# Patient Record
Sex: Female | Born: 1970
Health system: Southern US, Community
[De-identification: ages and names within clinical notes are randomized; demographics above are authoritative.]

## PROBLEM LIST (undated history)

## (undated) DIAGNOSIS — K859 Acute pancreatitis without necrosis or infection, unspecified: Secondary | ICD-10-CM

## (undated) DIAGNOSIS — E282 Polycystic ovarian syndrome: Secondary | ICD-10-CM

## (undated) DIAGNOSIS — N83209 Unspecified ovarian cyst, unspecified side: Secondary | ICD-10-CM

## (undated) DIAGNOSIS — K831 Obstruction of bile duct: Secondary | ICD-10-CM

## (undated) HISTORY — PX: CHOLECYSTECTOMY: SHX55

## (undated) HISTORY — PX: WISDOM TOOTH EXTRACTION: SHX21

---

## 1999-06-29 ENCOUNTER — Inpatient Hospital Stay (HOSPITAL_COMMUNITY): Admission: AD | Admit: 1999-06-29 | Discharge: 1999-06-29 | Payer: Self-pay | Admitting: Obstetrics and Gynecology

## 2001-09-25 ENCOUNTER — Ambulatory Visit (HOSPITAL_COMMUNITY): Admission: RE | Admit: 2001-09-25 | Discharge: 2001-09-25 | Payer: Self-pay | Admitting: Gynecology

## 2001-09-25 ENCOUNTER — Encounter: Payer: Self-pay | Admitting: Gynecology

## 2001-09-30 ENCOUNTER — Encounter: Payer: Self-pay | Admitting: Obstetrics and Gynecology

## 2001-09-30 ENCOUNTER — Inpatient Hospital Stay (HOSPITAL_COMMUNITY): Admission: AD | Admit: 2001-09-30 | Discharge: 2001-09-30 | Payer: Self-pay | Admitting: *Deleted

## 2003-03-22 ENCOUNTER — Other Ambulatory Visit: Admission: RE | Admit: 2003-03-22 | Discharge: 2003-03-22 | Payer: Self-pay | Admitting: Gynecology

## 2007-07-23 ENCOUNTER — Encounter: Admission: RE | Admit: 2007-07-23 | Discharge: 2007-07-23 | Payer: Self-pay | Admitting: Family Medicine

## 2007-08-09 ENCOUNTER — Encounter: Admission: RE | Admit: 2007-08-09 | Discharge: 2007-08-09 | Payer: Self-pay | Admitting: Family Medicine

## 2008-03-11 ENCOUNTER — Encounter: Admission: RE | Admit: 2008-03-11 | Discharge: 2008-03-11 | Payer: Self-pay | Admitting: Family Medicine

## 2009-06-03 ENCOUNTER — Emergency Department (HOSPITAL_BASED_OUTPATIENT_CLINIC_OR_DEPARTMENT_OTHER): Admission: EM | Admit: 2009-06-03 | Discharge: 2009-06-03 | Payer: Self-pay | Admitting: Emergency Medicine

## 2011-02-10 ENCOUNTER — Emergency Department (HOSPITAL_BASED_OUTPATIENT_CLINIC_OR_DEPARTMENT_OTHER)
Admission: EM | Admit: 2011-02-10 | Discharge: 2011-02-10 | Disposition: A | Discharge status: Home / Self Care | Payer: BC Managed Care – PPO | Attending: Emergency Medicine | Admitting: Emergency Medicine

## 2011-02-10 DIAGNOSIS — K2970 Gastritis, unspecified, without bleeding: Secondary | ICD-10-CM | POA: Insufficient documentation

## 2011-02-10 DIAGNOSIS — K2990 Gastroduodenitis, unspecified, without bleeding: Secondary | ICD-10-CM | POA: Insufficient documentation

## 2011-02-10 DIAGNOSIS — R1013 Epigastric pain: Secondary | ICD-10-CM | POA: Insufficient documentation

## 2011-02-10 LAB — CBC
Hemoglobin: 14.2 g/dL (ref 12.0–15.0)
MCHC: 35.9 g/dL (ref 30.0–36.0)
Platelets: 272 10*3/uL (ref 150–400)

## 2011-02-10 LAB — URINALYSIS, ROUTINE W REFLEX MICROSCOPIC
Bilirubin Urine: NEGATIVE
Hgb urine dipstick: NEGATIVE
Specific Gravity, Urine: 1.012 (ref 1.005–1.030)
pH: 6 (ref 5.0–8.0)

## 2011-02-10 LAB — COMPREHENSIVE METABOLIC PANEL
ALT: 9 U/L (ref 0–35)
AST: 16 U/L (ref 0–37)
Albumin: 4.5 g/dL (ref 3.5–5.2)
Alkaline Phosphatase: 46 U/L (ref 39–117)
GFR calc Af Amer: >60 mL/min (ref >60)
Glucose, Bld: 111 mg/dL — ABNORMAL HIGH (ref 70–99)
Potassium: 3.9 mEq/L (ref 3.5–5.1)
Sodium: 138 mEq/L (ref 135–145)
Total Protein: 7.5 g/dL (ref 6.0–8.3)

## 2011-02-10 LAB — DIFFERENTIAL
Basophils Absolute: 0 10*3/uL (ref 0.0–0.1)
Basophils Relative: 0 % (ref 0–1)
Monocytes Absolute: 0.9 10*3/uL (ref 0.1–1.0)
Neutro Abs: 8 10*3/uL — ABNORMAL HIGH (ref 1.7–7.7)
Neutrophils Relative %: 72 % (ref 43–77)

## 2011-02-10 LAB — PREGNANCY, URINE: Preg Test, Ur: NEGATIVE

## 2011-02-10 LAB — URINE MICROSCOPIC-ADD ON

## 2011-02-13 ENCOUNTER — Emergency Department (HOSPITAL_BASED_OUTPATIENT_CLINIC_OR_DEPARTMENT_OTHER)
Admission: EM | Admit: 2011-02-13 | Discharge: 2011-02-13 | Disposition: A | Discharge status: Home / Self Care | Payer: BC Managed Care – PPO | Attending: Emergency Medicine | Admitting: Emergency Medicine

## 2011-02-13 DIAGNOSIS — K2970 Gastritis, unspecified, without bleeding: Secondary | ICD-10-CM | POA: Insufficient documentation

## 2011-02-13 DIAGNOSIS — R1013 Epigastric pain: Secondary | ICD-10-CM | POA: Insufficient documentation

## 2011-02-13 DIAGNOSIS — K2990 Gastroduodenitis, unspecified, without bleeding: Secondary | ICD-10-CM | POA: Insufficient documentation

## 2011-02-13 LAB — LIPASE, BLOOD: Lipase: 45 U/L (ref 11–59)

## 2011-02-13 LAB — COMPREHENSIVE METABOLIC PANEL
Alkaline Phosphatase: 48 U/L (ref 39–117)
BUN: 9 mg/dL (ref 6–23)
CO2: 25 mEq/L (ref 19–32)
Chloride: 100 mEq/L (ref 96–112)
Creatinine, Ser: 0.6 mg/dL (ref 0.4–1.2)
GFR calc Af Amer: >60 mL/min (ref >60)
GFR calc non Af Amer: >60 mL/min (ref >60)
Glucose, Bld: 93 mg/dL (ref 70–99)
Total Bilirubin: 0.8 mg/dL (ref 0.3–1.2)

## 2016-03-11 ENCOUNTER — Emergency Department (HOSPITAL_BASED_OUTPATIENT_CLINIC_OR_DEPARTMENT_OTHER)
Admission: EM | Admit: 2016-03-11 | Discharge: 2016-03-11 | Disposition: A | Discharge status: Home / Self Care | Payer: BLUE CROSS/BLUE SHIELD | Attending: Emergency Medicine | Admitting: Emergency Medicine

## 2016-03-11 ENCOUNTER — Emergency Department (HOSPITAL_BASED_OUTPATIENT_CLINIC_OR_DEPARTMENT_OTHER): Payer: BLUE CROSS/BLUE SHIELD

## 2016-03-11 ENCOUNTER — Encounter (HOSPITAL_BASED_OUTPATIENT_CLINIC_OR_DEPARTMENT_OTHER): Payer: Self-pay | Admitting: Emergency Medicine

## 2016-03-11 DIAGNOSIS — N7011 Chronic salpingitis: Secondary | ICD-10-CM | POA: Insufficient documentation

## 2016-03-11 DIAGNOSIS — R109 Unspecified abdominal pain: Secondary | ICD-10-CM

## 2016-03-11 DIAGNOSIS — R1032 Left lower quadrant pain: Secondary | ICD-10-CM | POA: Diagnosis present

## 2016-03-11 HISTORY — DX: Unspecified ovarian cyst, unspecified side: N83.209

## 2016-03-11 HISTORY — DX: Acute pancreatitis without necrosis or infection, unspecified: K83.1

## 2016-03-11 HISTORY — DX: Acute pancreatitis without necrosis or infection, unspecified: K85.90

## 2016-03-11 HISTORY — DX: Polycystic ovarian syndrome: E28.2

## 2016-03-11 LAB — URINALYSIS, ROUTINE W REFLEX MICROSCOPIC
Bilirubin Urine: NEGATIVE
Glucose, UA: NEGATIVE mg/dL
Hgb urine dipstick: NEGATIVE
KETONES UR: NEGATIVE mg/dL
LEUKOCYTES UA: NEGATIVE
Nitrite: NEGATIVE
PROTEIN: NEGATIVE mg/dL
Specific Gravity, Urine: 1.006 (ref 1.005–1.030)
pH: 7.5 (ref 5.0–8.0)

## 2016-03-11 LAB — COMPREHENSIVE METABOLIC PANEL
ALBUMIN: 4.2 g/dL (ref 3.5–5.0)
ALT: 14 U/L (ref 14–54)
ANION GAP: 9 (ref 5–15)
AST: 19 U/L (ref 15–41)
Alkaline Phosphatase: 42 U/L (ref 38–126)
BUN: 10 mg/dL (ref 6–20)
CHLORIDE: 105 mmol/L (ref 101–111)
CO2: 23 mmol/L (ref 22–32)
Calcium: 9.2 mg/dL (ref 8.9–10.3)
Creatinine, Ser: 0.68 mg/dL (ref 0.44–1.00)
GFR calc Af Amer: >60 mL/min (ref >60)
GFR calc non Af Amer: >60 mL/min (ref >60)
GLUCOSE: 103 mg/dL — AB (ref 65–99)
POTASSIUM: 3.1 mmol/L — AB (ref 3.5–5.1)
SODIUM: 137 mmol/L (ref 135–145)
TOTAL PROTEIN: 7.1 g/dL (ref 6.5–8.1)
Total Bilirubin: 1.2 mg/dL (ref 0.3–1.2)

## 2016-03-11 LAB — CBC WITH DIFFERENTIAL/PLATELET
Basophils Absolute: 0 10*3/uL (ref 0.0–0.1)
Basophils Relative: 0 %
EOS PCT: 3 %
Eosinophils Absolute: 0.2 10*3/uL (ref 0.0–0.7)
HCT: 39.3 % (ref 36.0–46.0)
Hemoglobin: 14 g/dL (ref 12.0–15.0)
LYMPHS ABS: 2.2 10*3/uL (ref 0.7–4.0)
LYMPHS PCT: 31 %
MCH: 33.3 pg (ref 26.0–34.0)
MCHC: 35.6 g/dL (ref 30.0–36.0)
MCV: 93.6 fL (ref 78.0–100.0)
MONO ABS: 0.6 10*3/uL (ref 0.1–1.0)
MONOS PCT: 8 %
Neutro Abs: 4.1 10*3/uL (ref 1.7–7.7)
Neutrophils Relative %: 58 %
PLATELETS: 291 10*3/uL (ref 150–400)
RBC: 4.2 MIL/uL (ref 3.87–5.11)
RDW: 12.2 % (ref 11.5–15.5)
WBC: 7.1 10*3/uL (ref 4.0–10.5)

## 2016-03-11 LAB — WET PREP, GENITAL
CLUE CELLS WET PREP: NONE SEEN
Sperm: NONE SEEN
TRICH WET PREP: NONE SEEN
YEAST WET PREP: NONE SEEN

## 2016-03-11 LAB — LIPASE, BLOOD: LIPASE: 32 U/L (ref 11–51)

## 2016-03-11 LAB — PREGNANCY, URINE: PREG TEST UR: NEGATIVE

## 2016-03-11 MED ORDER — AZITHROMYCIN 250 MG PO TABS: 1000.0000 mg | ORAL_TABLET | ORAL | Status: DC

## 2016-03-11 MED ORDER — ONDANSETRON HCL 4 MG/2ML IJ SOLN: 4.0000 mg | Freq: Once | INTRAMUSCULAR | Status: DC

## 2016-03-11 MED ORDER — ONDANSETRON HCL 4 MG/2ML IJ SOLN
4.0000 mg | Freq: Once | INTRAMUSCULAR | Status: AC
  Administered 2016-03-11: 4 mg via INTRAVENOUS

## 2016-03-11 MED ORDER — CEFTRIAXONE SODIUM 250 MG IJ SOLR
250.0000 mg | Freq: Once | INTRAMUSCULAR | Status: DC
  Filled 2016-03-11: qty 250

## 2016-03-11 MED ORDER — SODIUM CHLORIDE 0.9 % IV BOLUS (SEPSIS)
1000.0000 mL | Freq: Once | INTRAVENOUS | Status: AC
  Administered 2016-03-11: 1000 mL via INTRAVENOUS

## 2016-03-11 MED ORDER — ONDANSETRON HCL 4 MG/2ML IJ SOLN
INTRAMUSCULAR | Status: AC
  Administered 2016-03-11: 4 mg via INTRAVENOUS
  Filled 2016-03-11: qty 2

## 2016-03-11 MED ORDER — ONDANSETRON 4 MG PO TBDP: ORAL_TABLET | ORAL | Status: DC

## 2016-03-11 MED ORDER — MORPHINE SULFATE (PF) 4 MG/ML IV SOLN
6.0000 mg | Freq: Once | INTRAVENOUS | Status: AC
  Administered 2016-03-11: 6 mg via INTRAVENOUS

## 2016-03-11 MED ORDER — DEXTROSE 5 % IV SOLN
250.0000 mg | Freq: Once | INTRAVENOUS | Status: AC
  Administered 2016-03-11: 250 mg via INTRAVENOUS
  Filled 2016-03-11: qty 250

## 2016-03-11 MED ORDER — ONDANSETRON HCL 4 MG/2ML IJ SOLN: 4.0000 mg | Freq: Once | INTRAMUSCULAR | Status: DC | PRN

## 2016-03-11 MED ORDER — MORPHINE SULFATE 15 MG PO TABS: 15.0000 mg | ORAL_TABLET | ORAL | Status: DC | PRN

## 2016-03-11 MED ORDER — AZITHROMYCIN 250 MG PO TABS
1000.0000 mg | ORAL_TABLET | Freq: Once | ORAL | Status: AC
  Administered 2016-03-11: 1000 mg via ORAL
  Filled 2016-03-11: qty 4

## 2016-03-11 MED FILL — ONDANSETRON ODT 4 MG TABLET: 4 | 3 days supply | Qty: 20 | Fill #0

## 2016-03-11 MED FILL — AZITHROMYCIN 250 MG TABLET: 250 | 1 days supply | Qty: 4 | Fill #0

## 2016-03-11 MED FILL — MORPHINE SULFATE IR 15 MG T: 15 | 1 days supply | Qty: 7 | Fill #0

## 2016-03-11 NOTE — ED Notes (Signed)
Pt having LLQ abdominal pain since 6 am.  No N/V.  No fever.  No back pain.  No history of kidney stones.  Pt states she has had ruptured ovarian cyst.

## 2016-03-11 NOTE — ED Provider Notes (Signed)
CSN: [id number]     Arrival date & time 03/11/16  0732 History   First MD Initiated Contact with Patient 03/11/16 [phone]     Chief Complaint  Patient presents with  . Abdominal Pain     (Consider location/radiation/quality/duration/timing/severity/associated sxs/prior Treatment) Patient is a 45 y.o. female presenting with abdominal pain. The history is provided by the patient.  Abdominal Pain Pain location:  LLQ Pain quality: sharp and shooting   Pain radiates to:  Does not radiate Pain severity:  Severe Onset quality:  Sudden Duration:  1 hour Timing:  Constant Progression:  Worsening Chronicity:  New Relieved by:  Nothing Worsened by:  Nothing tried Ineffective treatments:  None tried Associated symptoms: no chest pain, no chills, no dysuria, no fever, no nausea, no shortness of breath, no vaginal discharge and no vomiting     45 yo F With a chief complaint of sudden onset left adnexal tenderness. The started about an hour ago. Sharp severe. Pain is unrelenting. Nothing seems to make it better or worse. Denies vaginal bleeding vaginal discharge. Denies likelihood being pregnant. Patient has had a history of multiple ovarian cysts in the past as well as a ruptured ovarian cyst. She is unsure if this feels similar. She denies fevers or chills denies diarrhea.  Past Medical History  Diagnosis Date  . PCOD (polycystic ovarian disease)   . Ruptured ovarian cyst   . Pancreatitis due to biliary obstruction    Past Surgical History  Procedure Laterality Date  . Cholecystectomy     No family history on file. Social History  Substance Use Topics  . Smoking status: Never Smoker   . Smokeless tobacco: None  . Alcohol Use: None   OB History    No data available     Review of Systems  Constitutional: Negative for fever and chills.  HENT: Negative for congestion and rhinorrhea.   Eyes: Negative for redness and visual disturbance.  Respiratory: Negative for shortness of breath  and wheezing.   Cardiovascular: Negative for chest pain and palpitations.  Gastrointestinal: Positive for abdominal pain. Negative for nausea and vomiting.  Genitourinary: Positive for pelvic pain. Negative for dysuria, urgency, flank pain, vaginal discharge, difficulty urinating and dyspareunia.  Musculoskeletal: Negative for myalgias and arthralgias.  Skin: Negative for pallor and wound.  Neurological: Negative for dizziness and headaches.      Allergies  Erythromycin; Latex; Lidocaine; Sulfa antibiotics; and Tetracyclines & related  Home Medications   Prior to Admission medications   Medication Sig Start Date End Date Taking? Authorizing Provider  azithromycin (ZITHROMAX) 250 MG tablet Take 4 tablets (1,000 mg total) by mouth once a week. 4 tabs by mouth x1 on 03/18/16 03/11/16   Melene Plan, DO  morphine (MSIR) 15 MG tablet Take 1 tablet (15 mg total) by mouth every 4 (four) hours as needed for severe pain. 03/11/16   Melene Plan, DO  ondansetron (ZOFRAN ODT) 4 MG disintegrating tablet 4mg  ODT q4 hours prn nausea/vomit 03/11/16   Melene Plan, DO   BP 107/73 mmHg  Pulse 56  Temp(Src) 98.2 F (36.8 C) (Oral)  Resp 16  SpO2 99%  LMP 02/15/2016 Physical Exam  Constitutional: She is oriented to person, place, and time. She appears well-developed and well-nourished. No distress.  HENT:  Head: Normocephalic and atraumatic.  Eyes: EOM are normal. Pupils are equal, round, and reactive to light.  Neck: Normal range of motion. Neck supple.  Cardiovascular: Normal rate and regular rhythm.  Exam reveals no gallop and  no friction rub.   No murmur heard. Pulmonary/Chest: Effort normal. She has no wheezes. She has no rales.  Abdominal: Soft. She exhibits no distension. There is no tenderness.  Genitourinary: Uterus is tender. Cervix exhibits discharge (brownish) and friability. Cervix exhibits no motion tenderness. Right adnexum displays no mass and no tenderness. Left adnexum displays tenderness.  Left adnexum displays no mass and no fullness.  Musculoskeletal: She exhibits no edema or tenderness.  Neurological: She is alert and oriented to person, place, and time.  Skin: Skin is warm and dry. She is not diaphoretic.  Psychiatric: She has a normal mood and affect. Her behavior is normal.  Nursing note and vitals reviewed.   ED Course  Procedures (including critical care time) Labs Review Labs Reviewed  WET PREP, GENITAL - Abnormal; Notable for the following:    WBC, Wet Prep HPF POC MANY (*)    All other components within normal limits  COMPREHENSIVE METABOLIC PANEL - Abnormal; Notable for the following:    Potassium 3.1 (*)    Glucose, Bld 103 (*)    All other components within normal limits  LIPASE, BLOOD  URINALYSIS, ROUTINE W REFLEX MICROSCOPIC (NOT AT [facility])  PREGNANCY, URINE  CBC WITH DIFFERENTIAL/PLATELET  GC/CHLAMYDIA PROBE AMP ([place]) NOT AT [facility]    Imaging Review [place] Transvaginal Non-ob  03/11/2016  CLINICAL DATA:  Left lower quadrant pain since 2 a.m. this morning. EXAM: TRANSABDOMINAL AND TRANSVAGINAL ULTRASOUND OF PELVIS TECHNIQUE: Both transabdominal and transvaginal ultrasound examinations of the pelvis were performed. Transabdominal technique was performed for global imaging of the pelvis including uterus, ovaries, adnexal regions, and pelvic cul-de-sac. It was necessary to proceed with endovaginal exam following the transabdominal exam to visualize the uterus, endometrium, ovaries and adnexa . COMPARISON:  None FINDINGS: Uterus Measurements: 6.8 x 5.6 x 6.2 cm. No fibroids or other mass visualized. Endometrium Thickness: 10 mm in thickness.  No focal abnormality visualized. Right ovary Measurements: 2.8 x 1.8 x 2.1 cm. Normal appearance/no adnexal mass. Left ovary Measurements: 2.2 x 1.8 x 1.8 cm. Hypoechoic fluid-filled tube noted adjacent to the left ovary compatible with hydrosalpinx. Other findings No abnormal free fluid. IMPRESSION: Left hydrosalpinx. No  acute findings. Electronically Signed   By: Charlett NoseKevin  Dover M.D.   On: 03/11/2016 09:45   [place] Pelvis Complete  03/11/2016  CLINICAL DATA:  Left lower quadrant pain since 2 a.m. this morning. EXAM: TRANSABDOMINAL AND TRANSVAGINAL ULTRASOUND OF PELVIS TECHNIQUE: Both transabdominal and transvaginal ultrasound examinations of the pelvis were performed. Transabdominal technique was performed for global imaging of the pelvis including uterus, ovaries, adnexal regions, and pelvic cul-de-sac. It was necessary to proceed with endovaginal exam following the transabdominal exam to visualize the uterus, endometrium, ovaries and adnexa . COMPARISON:  None FINDINGS: Uterus Measurements: 6.8 x 5.6 x 6.2 cm. No fibroids or other mass visualized. Endometrium Thickness: 10 mm in thickness.  No focal abnormality visualized. Right ovary Measurements: 2.8 x 1.8 x 2.1 cm. Normal appearance/no adnexal mass. Left ovary Measurements: 2.2 x 1.8 x 1.8 cm. Hypoechoic fluid-filled tube noted adjacent to the left ovary compatible with hydrosalpinx. Other findings No abnormal free fluid. IMPRESSION: Left hydrosalpinx. No acute findings. Electronically Signed   By: Charlett NoseKevin  Dover M.D.   On: 03/11/2016 09:45   I have personally reviewed and evaluated these images and lab results as part of my medical decision-making.   EKG Interpretation None      MDM   Final diagnoses:  Abdominal pain  Hydrosalpinx  45 yo F with a chief complaint of left adnexal pain. This was sudden in onset I'm concerned for ovarian pathlology. Ultrasound ordered.  L hydrosalpinx on [place].   Discussed with H-Smith, GYN, will treat for possible STD.  Follow up in clinic.   12:06 PM:  I have discussed the diagnosis/risks/treatment options with the patient and family and believe the pt to be eligible for discharge home to follow-up with Gyn. We also discussed returning to the ED immediately if new or worsening sx occur. We discussed the sx which are most  concerning (e.g., sudden worsening pain, fever, inability to tolerate by mouth) that necessitate immediate return. Medications administered to the patient during their visit and any new prescriptions provided to the patient are listed below.  Medications given during this visit Medications  morphine 4 MG/ML injection 6 mg (6 mg Intravenous Given 03/11/16 0810)  sodium chloride 0.9 % bolus 1,000 mL (0 mLs Intravenous Stopped 03/11/16 1051)  ondansetron (ZOFRAN) injection 4 mg (4 mg Intravenous Given 03/11/16 0810)  azithromycin (ZITHROMAX) tablet 1,000 mg (1,000 mg Oral Given 03/11/16 1040)  cefTRIAXone (ROCEPHIN) 250 mg in dextrose 5 % 50 mL IVPB (0 mg Intravenous Stopped 03/11/16 1137)  ondansetron (ZOFRAN) injection 4 mg (4 mg Intravenous Given 03/11/16 1136)    Discharge Medication List as of 03/11/2016 10:31 AM    START taking these medications   Details  azithromycin (ZITHROMAX) 250 MG tablet Take 4 tablets (1,000 mg total) by mouth once a week. 4 tabs by mouth x1 on 03/18/16, Starting 03/11/2016, Until Discontinued, Print    morphine (MSIR) 15 MG tablet Take 1 tablet (15 mg total) by mouth every 4 (four) hours as needed for severe pain., Starting 03/11/2016, Until Discontinued, Print    ondansetron (ZOFRAN ODT) 4 MG disintegrating tablet  ODT q4 hours prn nausea/vomit, Print        The patient appears reasonably screen and/or stabilized for discharge and I doubt any other medical condition or other [facility] requiring further screening, evaluation, or treatment in the ED at this time prior to discha27rge.      Melene Plan, DO 03/11/16 1206

## 2016-03-11 NOTE — Discharge Instructions (Signed)
GYN should give you a call in the next few days for an appointment in a week or two.  Pelvic Inflammatory Disease Pelvic inflammatory disease (PID) refers to an infection in some or all of the female organs. The infection can be in the uterus, ovaries, fallopian tubes, or the surrounding tissues in the pelvis. PID can cause abdominal or pelvic pain that comes on suddenly (acute pelvic pain). PID is a serious infection because it can lead to lasting (chronic) pelvic pain or the inability to have children (infertility). CAUSES This condition is most often caused by an infection that is spread during sexual contact. However, the infection can also be caused by the normal bacteria that are found in the vaginal tissues if these bacteria travel upward into the reproductive organs. PID can also occur following:  The birth of a baby.  A miscarriage.  An abortion.  Major pelvic surgery.  The use of an intrauterine device (IUD).  A sexual assault. RISK FACTORS This condition is more likely to develop in women who:  Are younger than 43 years of age.  Are sexually active at [facility] age.  Use nonbarrier contraception.  Have multiple sexual partners.  Have sex with someone who has symptoms of an STD (sexually transmitted disease).  Use oral contraception. At times, certain behaviors can also increase the possibility of getting PID, such as:  Using a vaginal douche.  Having an IUD in place. SYMPTOMS Symptoms of this condition include:  Abdominal or pelvic pain.  Fever.  Chills.  Abnormal vaginal discharge.  Abnormal uterine bleeding.  Unusual pain shortly after the end of a menstrual period.  Painful urination.  Pain with sexual intercourse.  Nausea and vomiting. DIAGNOSIS To diagnose this condition, your health care provider will do a physical exam and take your medical history. A pelvic exam typically reveals great tenderness in the uterus and the surrounding pelvic  tissues. You may also have tests, such as:  Lab tests, including a pregnancy test, blood tests, and urine test.  Culture tests of the vagina and cervix to check for an STD.  Ultrasound.  A laparoscopic procedure to look inside the pelvis.  Examining vaginal secretions under a microscope. TREATMENT Treatment for this condition may involve one or more approaches.  Antibiotic medicines may be prescribed to be taken by mouth.  Sexual partners may need to be treated if the infection is caused by an STD.  For more severe cases, hospitalization may be needed to give antibiotics directly into a vein through an IV tube.  Surgery may be needed if other treatments do not help, but this is rare. It may take weeks until you are completely well. If you are diagnosed with PID, you should also be checked for human immunodeficiency virus (HIV). Your health care provider may test you for infection again 3 months after treatment. You should not have unprotected sex. HOME CARE INSTRUCTIONS  Take over-the-counter and prescription medicines only as told by your health care provider.  If you were prescribed an antibiotic medicine, take it as told by your health care provider. Do not stop taking the antibiotic even if you start to feel better.  Do not have sexual intercourse until treatment is completed or as told by your health care provider. If PID is confirmed, your recent sexual partners will need treatment, especially if you had unprotected sex.  Keep all follow-up visits as told by your health care provider. This is important. SEEK MEDICAL CARE IF:  You have increased  or abnormal vaginal discharge.  Your pain does not improve.  You vomit.  You have a fever.  You cannot tolerate your medicines.  Your partner has an STD.  You have pain when you urinate. SEEK IMMEDIATE MEDICAL CARE IF:  You have increased abdominal or pelvic pain.  You have chills.  Your symptoms are not better in 72  hours even with treatment.   This information is not intended to replace advice given to you by your health care provider. Make sure you discuss any questions you have with your health care provider.   Document Released: 08/19/2005 Document Revised: 05/10/2015 Document Reviewed: 09/26/2014 Elsevier Interactive Patient Education [employer]2016 Elsevier Inc.

## 2016-03-12 LAB — GC/CHLAMYDIA PROBE AMP (~~LOC~~) NOT AT ARMC
Chlamydia: NEGATIVE
NEISSERIA GONORRHEA: NEGATIVE

## 2016-03-21 ENCOUNTER — Ambulatory Visit (INDEPENDENT_AMBULATORY_CARE_PROVIDER_SITE_OTHER): Payer: BLUE CROSS/BLUE SHIELD | Admitting: Obstetrics & Gynecology

## 2016-03-21 ENCOUNTER — Encounter: Payer: Self-pay | Admitting: Obstetrics & Gynecology

## 2016-03-21 ENCOUNTER — Encounter: Payer: Managed Care, Other (non HMO) | Admitting: Obstetrics & Gynecology

## 2016-03-21 VITALS — BP 127/79 | HR 79 | Ht 65.5 in | Wt 139.0 lb

## 2016-03-21 DIAGNOSIS — N7011 Chronic salpingitis: Secondary | ICD-10-CM | POA: Diagnosis not present

## 2016-03-21 NOTE — Patient Instructions (Signed)
You need a pap smear soon, please call to set up appointment

## 2016-03-21 NOTE — Progress Notes (Signed)
CLINIC ENCOUNTER NOTE  History:  45 y.o. P1 here today for follow up after being evaluated for LLQ pain in [facility] and being diagnosed with hydrosalpinx on 03/11/16. She was given analgesia and treated for presumptive PID. Since then, pain has decreased. Denies any fevers or worsening symptoms.  She denies any abnormal vaginal discharge, bleeding, pelvic pain or other concerns.   Past Medical History  Diagnosis Date  . PCOD (polycystic ovarian disease)   . Ruptured ovarian cyst   . Pancreatitis due to biliary obstruction     Past Surgical History  Procedure Laterality Date  . Cholecystectomy      The following portions of the patient's history were reviewed and updated as appropriate: allergies, current medications, past family history, past medical history, past social history, past surgical history and problem list.   Health Maintenance:  Normal pap several years ago.  Review of Systems:  Pertinent items noted in HPI and remainder of comprehensive ROS otherwise negative.  Objective:  Physical Exam BP 127/79 mmHg  Pulse 79  Ht 5' 5.5" (1.664 m)  Wt 139 lb (63.05 kg)  BMI 22.77 kg/m2  LMP 02/15/2016 CONSTITUTIONAL: Well-developed, well-nourished female in no acute distress.  HENT:  Normocephalic, atraumatic. External right and left ear normal. Oropharynx is clear and moist EYES: Conjunctivae and EOM are normal. Pupils are equal, round, and reactive to light. No scleral icterus.  NECK: Normal range of motion, supple, no masses SKIN: Skin is warm and dry. No rash noted. Not diaphoretic. No erythema. No pallor. NEUROLOGIC: Alert and oriented to person, place, and time. Normal reflexes, muscle tone coordination. No cranial nerve deficit noted. PSYCHIATRIC: Normal mood and affect. Normal behavior. Normal judgment and thought content. CARDIOVASCULAR: Normal heart rate noted RESPIRATORY: Effort and breath sounds normal, no problems with respiration noted ABDOMEN: Soft, no  distention noted. No tenderness on palpation in lowe abdomen.   PELVIC: Deferred MUSCULOSKELETAL: Normal range of motion. No edema noted.  Labs and Imaging  Results for orders placed or performed during the hospital encounter of 03/11/16 (from the past 336 hour(s))  GC/Chlamydia probe amp ([place])not at [facility]   Collection Time: 03/11/16 12:00 AM  Result Value Ref Range   Chlamydia Negative    Neisseria gonorrhea Negative   Wet prep, genital   Collection Time: 03/11/16  7:50 AM  Result Value Ref Range   Yeast Wet Prep HPF POC NONE SEEN NONE SEEN   Trich, Wet Prep NONE SEEN NONE SEEN   Clue Cells Wet Prep HPF POC NONE SEEN NONE SEEN   WBC, Wet Prep HPF POC MANY (A) NONE SEEN   Sperm NONE SEEN   Lipase, blood   Collection Time: 03/11/16  8:00 AM  Result Value Ref Range   Lipase 32 11 - 51 U/L  Comprehensive metabolic panel   Collection Time: 03/11/16  8:00 AM  Result Value Ref Range   Sodium 137 135 - 145 mmol/L   Potassium 3.1 (L) 3.5 - 5.1 mmol/L   Chloride 105 101 - 111 mmol/L   CO2 23 22 - 32 mmol/L   Glucose, Bld 103 (H) 65 - 99 mg/dL   BUN 10 6 - 20 mg/dL   Creatinine, Ser 4.54 0.44 - 1.00 mg/dL   Calcium 9.2 8.9 - 09.8 mg/dL   Total Protein 7.1 6.5 - 8.1 g/dL   Albumin 4.2 3.5 - 5.0 g/dL   AST 19 15 - 41 U/L   ALT 14 14 - 54 U/L   Alkaline Phosphatase  42 38 - 126 U/L   Total Bilirubin 1.2 0.3 - 1.2 mg/dL   GFR calc non Af Amer >60 >60 mL/min   GFR calc Af Amer >60 >60 mL/min   Anion gap 9 5 - 15  CBC with Differential   Collection Time: 03/11/16  8:00 AM  Result Value Ref Range   WBC 7.1 4.0 - 10.5 K/uL   RBC 4.20 3.87 - 5.11 MIL/uL   Hemoglobin 14.0 12.0 - 15.0 g/dL   HCT 87.539.3 64.336.0 - 32.946.0 %   MCV 93.6 78.0 - 100.0 fL   MCH 33.3 26.0 - 34.0 pg   MCHC 35.6 30.0 - 36.0 g/dL   RDW 51.812.2 84.111.5 - 66.015.5 %   Platelets 291 150 - 400 K/uL   Neutrophils Relative % 58 %   Neutro Abs 4.1 1.7 - 7.7 K/uL   Lymphocytes Relative 31 %   Lymphs Abs 2.2 0.7 - 4.0 K/uL    Monocytes Relative 8 %   Monocytes Absolute 0.6 0.1 - 1.0 K/uL   Eosinophils Relative 3 %   Eosinophils Absolute 0.2 0.0 - 0.7 K/uL   Basophils Relative 0 %   Basophils Absolute 0.0 0.0 - 0.1 K/uL  Urinalysis, Routine w reflex microscopic   Collection Time: 03/11/16  9:10 AM  Result Value Ref Range   Color, Urine YELLOW YELLOW   APPearance CLEAR CLEAR   Specific Gravity, Urine 1.006 1.005 - 1.030   pH 7.5 5.0 - 8.0   Glucose, UA NEGATIVE NEGATIVE mg/dL   Hgb urine dipstick NEGATIVE NEGATIVE   Bilirubin Urine NEGATIVE NEGATIVE   Ketones, ur NEGATIVE NEGATIVE mg/dL   Protein, ur NEGATIVE NEGATIVE mg/dL   Nitrite NEGATIVE NEGATIVE   Leukocytes, UA NEGATIVE NEGATIVE  Pregnancy, urine   Collection Time: 03/11/16  9:10 AM  Result Value Ref Range   Preg Test, Ur NEGATIVE NEGATIVE    [place] Transvaginal Non-ob  03/11/2016  CLINICAL DATA:  Left lower quadrant pain since 2 a.m. this morning. EXAM: TRANSABDOMINAL AND TRANSVAGINAL ULTRASOUND OF PELVIS TECHNIQUE: Both transabdominal and transvaginal ultrasound examinations of the pelvis were performed. Transabdominal technique was performed for global imaging of the pelvis including uterus, ovaries, adnexal regions, and pelvic cul-de-sac. It was necessary to proceed with endovaginal exam following the transabdominal exam to visualize the uterus, endometrium, ovaries and adnexa . COMPARISON:  None FINDINGS: Uterus Measurements: 6.8 x 5.6 x 6.2 cm. No fibroids or other mass visualized. Endometrium Thickness: 10 mm in thickness.  No focal abnormality visualized. Right ovary Measurements: 2.8 x 1.8 x 2.1 cm. Normal appearance/no adnexal mass. Left ovary Measurements: 2.2 x 1.8 x 1.8 cm. Hypoechoic fluid-filled tube noted adjacent to the left ovary compatible with hydrosalpinx. Other findings No abnormal free fluid. IMPRESSION: Left hydrosalpinx. No acute findings. Electronically Signed   By: Charlett NoseKevin  Dover M.D.   On: 03/11/2016 09:45   [place] Pelvis  Complete  03/11/2016  CLINICAL DATA:  Left lower quadrant pain since 2 a.m. this morning. EXAM: TRANSABDOMINAL AND TRANSVAGINAL ULTRASOUND OF PELVIS TECHNIQUE: Both transabdominal and transvaginal ultrasound examinations of the pelvis were performed. Transabdominal technique was performed for global imaging of the pelvis including uterus, ovaries, adnexal regions, and pelvic cul-de-sac. It was necessary to proceed with endovaginal exam following the transabdominal exam to visualize the uterus, endometrium, ovaries and adnexa . COMPARISON:  None FINDINGS: Uterus Measurements: 6.8 x 5.6 x 6.2 cm. No fibroids or other mass visualized. Endometrium Thickness: 10 mm in thickness.  No focal abnormality visualized. Right ovary Measurements:  2.8 x 1.8 x 2.1 cm. Normal appearance/no adnexal mass. Left ovary Measurements: 2.2 x 1.8 x 1.8 cm. Hypoechoic fluid-filled tube noted adjacent to the left ovary compatible with hydrosalpinx. Other findings No abnormal free fluid. IMPRESSION: Left hydrosalpinx. No acute findings. Electronically Signed   By: Charlett Nose M.D.   On: 03/11/2016 09:45    Assessment & Plan:  Hydrosalpinx Pain is improved, no worsening symptoms. Already being treated for presumptive PID. No further intervention needed at this point.  Labs and imaging studies reviewed with patient. Routine preventative health maintenance measures emphasized, she was told to make appointment for pap smear. Please refer to After Visit Summary for other counseling recommendations.   Return if symptoms worsen or fail to improve.   Total face-to-face time with patient: 20 minutes. Over 50% of encounter was spent on counseling and coordination of care.   Jaynie Collins, MD, FACOG Attending Obstetrician & Gynecologist, [facility] for [employer], [facility]

## 2018-08-03 ENCOUNTER — Encounter: Payer: Self-pay | Admitting: Pediatrics

## 2018-08-03 ENCOUNTER — Ambulatory Visit (INDEPENDENT_AMBULATORY_CARE_PROVIDER_SITE_OTHER): Payer: Managed Care, Other (non HMO) | Admitting: Pediatrics

## 2018-08-03 VITALS — BP 108/66 | HR 73 | Temp 98.3°F | Resp 16 | Ht 65.5 in | Wt 132.0 lb

## 2018-08-03 DIAGNOSIS — L500 Allergic urticaria: Secondary | ICD-10-CM | POA: Diagnosis not present

## 2018-08-03 DIAGNOSIS — K581 Irritable bowel syndrome with constipation: Secondary | ICD-10-CM | POA: Diagnosis not present

## 2018-08-03 DIAGNOSIS — E739 Lactose intolerance, unspecified: Secondary | ICD-10-CM | POA: Insufficient documentation

## 2018-08-03 DIAGNOSIS — J3089 Other allergic rhinitis: Secondary | ICD-10-CM

## 2018-08-03 DIAGNOSIS — J4520 Mild intermittent asthma, uncomplicated: Secondary | ICD-10-CM | POA: Diagnosis not present

## 2018-08-03 MED ORDER — ALBUTEROL SULFATE HFA 108 (90 BASE) MCG/ACT IN AERS: INHALATION_SPRAY | RESPIRATORY_TRACT | 1 refills | Status: AC

## 2018-08-03 NOTE — Progress Notes (Signed)
[address] [place] [address] Dept: [phone]  New Patient Note  Patient ID: Brandi Davidson, female    DOB: 18-Feb-1971  Age: 47 y.o. MRN: [id number] Date of Office Visit: 08/03/2018 Referring provider: No referring provider defined for this encounter.    Chief Complaint: Allergies and Allergic Reaction (posion ivy 3 months ago hives whole body )  HPI Brandi Davidson presents for evaluation of hives since early October following poison ivy.  She has had seasonal allergic rhinitis since age 68.  She used to have asthma.  She has aggravation of her nasal symptoms on exposure to dust, possibly cats, possibly dogs, exposure to fragrances and temperature changes.  She has not had pneumonia.  She does not have gastroesophageal reflux.  She has a history of eczema.  She has IBS with constipation .  She had some food sensitivity testing in early November and has been avoiding dairy , eggs, oats, wheat , peppers, pineapple, yeast and other foods.. She has some lactose intolerance.  At this time she has shortness of breath only when she is exposed to strong colognes or perfumes  Review of Systems  Constitutional: Negative.   HENT:       Allergic rhinitis since age 49  Eyes: Negative.   Respiratory:       History of asthma when she was younger but no longer a problem  Cardiovascular: Negative.   Gastrointestinal:       IBS with chronic constipation.  Cholecystectomy  Genitourinary: Negative.   Musculoskeletal: Negative.   Skin:       Poison ivy dermatitis in October.  History of eczema.  Hives since mid October  Neurological:       History of migraine headaches  Endo/Heme/Allergies:       No diabetes.  Low thyroid function  Psychiatric/Behavioral: Negative.     Outpatient Encounter Medications as of 08/03/2018  Medication Sig  . cetirizine (ZYRTEC) 10 MG tablet Take 10 mg by mouth daily.  . fexofenadine (ALLEGRA) 180 MG tablet Take by mouth.  Marland Kitchen ibuprofen (ADVIL,MOTRIN) 800 MG  tablet Take 800 mg by mouth every 8 (eight) hours as needed.  Marland Kitchen LINZESS 290 MCG CAPS capsule   . triamcinolone (NASACORT) 55 MCG/ACT AERO nasal inhaler Place 2 sprays into the nose daily.  . [DISCONTINUED] triamcinolone (NASACORT) 55 MCG/ACT AERO nasal inhaler Place into the nose.  . albuterol (PROAIR HFA) 108 (90 Base) MCG/ACT inhaler Two puffs every 4 hours if needed for wheezing or coughing spells.  May use 2 puffs 5-15 minutes before exercise.  . [DISCONTINUED] azithromycin (ZITHROMAX) 250 MG tablet Take 4 tablets (1,000 mg total) by mouth once a week. 4 tabs by mouth x1 on 03/18/16 (Patient not taking: Reported on 03/21/2016)  . [DISCONTINUED] clonazePAM (KLONOPIN) 1 MG tablet   . [DISCONTINUED] morphine (MSIR) 15 MG tablet Take 1 tablet (15 mg total) by mouth every 4 (four) hours as needed for severe pain. (Patient not taking: Reported on 03/21/2016)  . [DISCONTINUED] ondansetron (ZOFRAN ODT) 4 MG disintegrating tablet 4mg  ODT q4 hours prn nausea/vomit (Patient not taking: Reported on 03/21/2016)   No facility-administered encounter medications on file as of 08/03/2018.      Drug Allergies:  Allergies  Allergen Reactions  . Adhesive [Tape]   . Erythromycin   . Latex   . Lidocaine   . Sulfa Antibiotics   . Tetracyclines & Related     Family History: Shatia's family history includes Allergic rhinitis in her son; Asthma in her  maternal grandmother and mother; Bronchitis in her maternal grandmother and mother; COPD in her mother; Eczema in her son; Emphysema in her maternal grandmother and mother; Hypertension in her sister..  Family history is negative for angioedema, chronic urticaria, food allergies, lupus, chronic bronchitis or emphysema  Social and environmental.  She has 2 dogs in the home and a turtle.  She is not exposed to cigarette smoking.  She smoked cigarettes for 12 years in the past averaging 1 pack/day.  She is a Environmental managerphotographer.  Physical Exam: BP 108/66   Pulse 73   Temp  98.3 F (36.8 C) (Oral)   Resp 16   Ht 5' 5.5" (1.664 m)   Wt 132 lb (59.9 kg)   SpO2 99%   BMI 21.63 kg/m    Physical Exam  Constitutional: She is oriented to person, place, and time. She appears well-developed and well-nourished.  HENT:  Eyes normal.  Ears normal.  Nose normal.  Pharynx normal.  Neck: Neck supple. No thyromegaly present.  Cardiovascular:  S1-S2 normal no murmurs  Pulmonary/Chest:  Clear to percussion and auscultation  Abdominal: Soft. There is no tenderness.  No hepatosplenomegaly  Lymphadenopathy:    She has no cervical adenopathy.  Neurological: She is alert and oriented to person, place, and time.  Skin:  Clear except for a few scars were she had poison ivy  Psychiatric: She has a normal mood and affect. Her behavior is normal. Judgment and thought content normal.  Vitals reviewed.   Diagnostics: Allergy skin tests were positive to cat, dog.  Mild reactivity to some molds on intradermal testing only.  Skin testing to foods was negative  FVC 3.82 L FEV1 3.48 L.  Predicted FVC 3.89 L predicted FEV1 3.12 L-the spirometry is in the normal range   Assessment  Assessment and Plan: 1. Mild intermittent asthma without complication   2. Other allergic rhinitis   3. Allergic urticaria   4. Irritable bowel syndrome with constipation   5. Lactose intolerance     Meds ordered this encounter  Medications  . albuterol (PROAIR HFA) 108 (90 Base) MCG/ACT inhaler    Sig: Two puffs every 4 hours if needed for wheezing or coughing spells.  May use 2 puffs 5-15 minutes before exercise.    Dispense:  18 g    Refill:  1    Patient Instructions  Environmental control of dust and mold Keep the dog out of your bedroom Allegra 180 mg-take 1 tablet once a day for itching.  You may add Benadryl 25 mg at night Nasacort 2 sprays per nostril at night for stuffy nose OpconA-1 drop 3 times a day if needed for itchy eyes Pro-air 2 puffs every 4 hours if needed for  wheezing or coughing spells.  You may use Pro-air 2 puffs 5 to 15 minutes before exercise Do foods with salicylates make you itch? Call us if you are not doing well on this treatment plan   Return in about 4 weeks (around 08/31/2018).   Thank you for the opportunity to care for this patient.  Please do not hesitate to contact me with questions.  Tonette BihariJ. A. Caitlyn Buchanan, M.D.  [facility] [address] [place], [place] [address] [phone]

## 2018-08-03 NOTE — Patient Instructions (Addendum)
Environmental control of dust and mold Keep the dog out of your bedroom Allegra 180 mg-take 1 tablet once a day for itching.  You may add Benadryl 25 mg at night Nasacort 2 sprays per nostril at night for stuffy nose OpconA-1 drop 3 times a day if needed for itchy eyes Pro-air 2 puffs every 4 hours if needed for wheezing or coughing spells.  You may use Pro-air 2 puffs 5 to 15 minutes before exercise Do foods with salicylates make you itch? Call us if you are not doing well on this treatment plan

## 2018-08-31 ENCOUNTER — Ambulatory Visit: Payer: Self-pay | Admitting: Pediatrics

## 2019-09-02 ENCOUNTER — Emergency Department (HOSPITAL_BASED_OUTPATIENT_CLINIC_OR_DEPARTMENT_OTHER): Payer: Managed Care, Other (non HMO)

## 2019-09-02 ENCOUNTER — Ambulatory Visit: Payer: Self-pay

## 2019-09-02 ENCOUNTER — Encounter (HOSPITAL_BASED_OUTPATIENT_CLINIC_OR_DEPARTMENT_OTHER): Payer: Self-pay

## 2019-09-02 ENCOUNTER — Other Ambulatory Visit: Payer: Self-pay

## 2019-09-02 ENCOUNTER — Emergency Department (HOSPITAL_BASED_OUTPATIENT_CLINIC_OR_DEPARTMENT_OTHER)
Admission: EM | Admit: 2019-09-02 | Discharge: 2019-09-02 | Disposition: A | Discharge status: Home / Self Care | Payer: Managed Care, Other (non HMO) | Attending: Emergency Medicine | Admitting: Emergency Medicine

## 2019-09-02 DIAGNOSIS — Z91048 Other nonmedicinal substance allergy status: Secondary | ICD-10-CM | POA: Insufficient documentation

## 2019-09-02 DIAGNOSIS — N83202 Unspecified ovarian cyst, left side: Secondary | ICD-10-CM | POA: Diagnosis not present

## 2019-09-02 DIAGNOSIS — Z881 Allergy status to other antibiotic agents status: Secondary | ICD-10-CM | POA: Diagnosis not present

## 2019-09-02 DIAGNOSIS — R1032 Left lower quadrant pain: Secondary | ICD-10-CM | POA: Diagnosis present

## 2019-09-02 DIAGNOSIS — Z91040 Latex allergy status: Secondary | ICD-10-CM | POA: Diagnosis not present

## 2019-09-02 DIAGNOSIS — Z882 Allergy status to sulfonamides status: Secondary | ICD-10-CM | POA: Insufficient documentation

## 2019-09-02 DIAGNOSIS — Z87891 Personal history of nicotine dependence: Secondary | ICD-10-CM | POA: Insufficient documentation

## 2019-09-02 DIAGNOSIS — N7011 Chronic salpingitis: Secondary | ICD-10-CM | POA: Diagnosis not present

## 2019-09-02 DIAGNOSIS — Z79899 Other long term (current) drug therapy: Secondary | ICD-10-CM | POA: Insufficient documentation

## 2019-09-02 DIAGNOSIS — R102 Pelvic and perineal pain: Secondary | ICD-10-CM

## 2019-09-02 LAB — CBC WITH DIFFERENTIAL/PLATELET
Abs Immature Granulocytes: 0.03 10*3/uL (ref 0.00–0.07)
Basophils Absolute: 0.1 10*3/uL (ref 0.0–0.1)
Basophils Relative: 1 %
Eosinophils Absolute: 0.1 10*3/uL (ref 0.0–0.5)
Eosinophils Relative: 1 %
HCT: 44.3 % (ref 36.0–46.0)
Hemoglobin: 15.4 g/dL — ABNORMAL HIGH (ref 12.0–15.0)
Immature Granulocytes: 0 %
Lymphocytes Relative: 14 %
Lymphs Abs: 1.5 10*3/uL (ref 0.7–4.0)
MCH: 33 pg (ref 26.0–34.0)
MCHC: 34.8 g/dL (ref 30.0–36.0)
MCV: 95.1 fL (ref 80.0–100.0)
Monocytes Absolute: 0.5 10*3/uL (ref 0.1–1.0)
Monocytes Relative: 4 %
Neutro Abs: 8.9 10*3/uL — ABNORMAL HIGH (ref 1.7–7.7)
Neutrophils Relative %: 80 %
Platelets: 311 10*3/uL (ref 150–400)
RBC: 4.66 MIL/uL (ref 3.87–5.11)
RDW: 12 % (ref 11.5–15.5)
WBC: 11 10*3/uL — ABNORMAL HIGH (ref 4.0–10.5)
nRBC: 0 % (ref 0.0–0.2)

## 2019-09-02 LAB — COMPREHENSIVE METABOLIC PANEL
ALT: 12 U/L (ref 0–44)
AST: 21 U/L (ref 15–41)
Albumin: 4.6 g/dL (ref 3.5–5.0)
Alkaline Phosphatase: 50 U/L (ref 38–126)
Anion gap: 14 (ref 5–15)
BUN: 9 mg/dL (ref 6–20)
CO2: 20 mmol/L — ABNORMAL LOW (ref 22–32)
Calcium: 9.4 mg/dL (ref 8.9–10.3)
Chloride: 103 mmol/L (ref 98–111)
Creatinine, Ser: 0.81 mg/dL (ref 0.44–1.00)
GFR calc Af Amer: >60 mL/min (ref >60)
GFR calc non Af Amer: >60 mL/min (ref >60)
Glucose, Bld: 131 mg/dL — ABNORMAL HIGH (ref 70–99)
Potassium: 3.4 mmol/L — ABNORMAL LOW (ref 3.5–5.1)
Sodium: 137 mmol/L (ref 135–145)
Total Bilirubin: 0.7 mg/dL (ref 0.3–1.2)
Total Protein: 7.4 g/dL (ref 6.5–8.1)

## 2019-09-02 LAB — URINALYSIS, ROUTINE W REFLEX MICROSCOPIC
Bilirubin Urine: NEGATIVE
Glucose, UA: NEGATIVE mg/dL
Ketones, ur: 15 mg/dL — AB
Nitrite: NEGATIVE
Protein, ur: NEGATIVE mg/dL
Specific Gravity, Urine: 1.01 (ref 1.005–1.030)
pH: 7 (ref 5.0–8.0)

## 2019-09-02 LAB — URINALYSIS, MICROSCOPIC (REFLEX)

## 2019-09-02 LAB — PREGNANCY, URINE: Preg Test, Ur: NEGATIVE

## 2019-09-02 LAB — WET PREP, GENITAL
Clue Cells Wet Prep HPF POC: NONE SEEN
Sperm: NONE SEEN
Trich, Wet Prep: NONE SEEN
Yeast Wet Prep HPF POC: NONE SEEN

## 2019-09-02 LAB — HIV ANTIBODY (ROUTINE TESTING W REFLEX): HIV Screen 4th Generation wRfx: NONREACTIVE

## 2019-09-02 LAB — HCG, SERUM, QUALITATIVE: Preg, Serum: NEGATIVE

## 2019-09-02 LAB — LIPASE, BLOOD: Lipase: 40 U/L (ref 11–51)

## 2019-09-02 MED ORDER — IOHEXOL 300 MG/ML  SOLN
100.0000 mL | Freq: Once | INTRAMUSCULAR | Status: AC | PRN
  Administered 2019-09-02: 100 mL via INTRAVENOUS

## 2019-09-02 MED ORDER — HYDROMORPHONE HCL 1 MG/ML IJ SOLN
1.0000 mg | Freq: Once | INTRAMUSCULAR | Status: AC
  Administered 2019-09-02: 1 mg via INTRAVENOUS
  Filled 2019-09-02: qty 1

## 2019-09-02 MED ORDER — NAPROXEN 500 MG PO TABS: 500.0000 mg | ORAL_TABLET | Freq: Two times a day (BID) | ORAL | 0 refills | Status: AC

## 2019-09-02 MED ORDER — SODIUM CHLORIDE 0.9 % IV BOLUS
500.0000 mL | Freq: Once | INTRAVENOUS | Status: AC
  Administered 2019-09-02: 500 mL via INTRAVENOUS

## 2019-09-02 MED ORDER — ONDANSETRON HCL 4 MG/2ML IJ SOLN
4.0000 mg | Freq: Once | INTRAMUSCULAR | Status: AC
  Administered 2019-09-02: 4 mg via INTRAVENOUS
  Filled 2019-09-02: qty 2

## 2019-09-02 NOTE — ED Provider Notes (Signed)
[facility] [place] EMERGENCY DEPARTMENT Provider Note   CSN: [id number] Arrival date & time: 09/02/19  1126     History Chief Complaint  Patient presents with   Abdominal Pain    Brandi Davidson is a 48 y.o. female history PCOD, pancreatitis, hydrosalpinx.  Patient reports sudden onset left lower quadrant pain at 8 AM this morning, sharp constant pain severe in intensity nonradiating no alleviating or aggravating factors.  Reports several episodes of nausea with nonbloody/nonbilious emesis after onset of pain.  Reports history of similar many years ago when she was diagnosed with hydrosalpinx.  She reports that prior to onset of pain she was in her normal state of health.  Of note patient reports she began her menstrual cycle this morning.  Denies fever/chills, headache, neck stiffness, chest pain/shortness of breath, diarrhea, dysuria/hematuria, concern for STI, fall/injury or additional concerns. HPI     Past Medical History:  Diagnosis Date   Pancreatitis due to biliary obstruction    PCOD (polycystic ovarian disease)    Ruptured ovarian cyst     Patient Active Problem List   Diagnosis Date Noted   Allergic urticaria 08/03/2018   Other allergic rhinitis 08/03/2018   Mild intermittent asthma without complication 08/03/2018   Lactose intolerance 08/03/2018   Irritable bowel syndrome with constipation 08/03/2018    Past Surgical History:  Procedure Laterality Date   CHOLECYSTECTOMY     WISDOM TOOTH EXTRACTION       OB History    Gravida  4   Para  1   Term  1   Preterm      AB      Living  1     SAB      TAB      Ectopic      Multiple      Live Births              Family History  Problem Relation Age of Onset   COPD Mother    Asthma Mother    Bronchitis Mother    Emphysema Mother    Hypertension Sister    Asthma Maternal Grandmother    Bronchitis Maternal Grandmother    Emphysema Maternal Grandmother     Allergic rhinitis Son    Eczema Son    Cancer Neg Hx    Urticaria Neg Hx    Immunodeficiency Neg Hx    Angioedema Neg Hx     Social History   Tobacco Use   Smoking status: Former Smoker    Types: Cigarettes    Quit date: 08/03/1998    Years since quitting: 21.0   Smokeless tobacco: Never Used  Substance Use Topics   Alcohol use: Yes    Comment: occasional/social   Drug use: Never    Home Medications Prior to Admission medications   Medication Sig Start Date End Date Taking? Authorizing Provider  albuterol (PROAIR HFA) 108 (90 Base) MCG/ACT inhaler Two puffs every 4 hours if needed for wheezing or coughing spells.  May use 2 puffs 5-15 minutes before exercise. 08/03/18   Fletcher Anon, MD  cetirizine (ZYRTEC) 10 MG tablet Take 10 mg by mouth daily.    [provider]  fexofenadine (ALLEGRA) 180 MG tablet Take by mouth.    [provider]  Karlene Einstein 290 MCG CAPS capsule  07/06/18   [provider]  naproxen (NAPROSYN) 500 MG tablet Take 1 tablet (500 mg total) by mouth 2 (two) times daily for 7 days. 09/02/19 09/09/19  Nuala Alpha A, PA-C  triamcinolone (NASACORT) 55 MCG/ACT AERO nasal inhaler Place 2 sprays into the nose daily.    [provider]    Allergies    Adhesive [tape], Erythromycin, Latex, Lidocaine, Sulfa antibiotics, and Tetracyclines & related  Review of Systems   Review of Systems Ten systems are reviewed and are negative for acute change except as noted in the HPI  Physical Exam Updated Vital Signs BP 117/72    Pulse 79    Temp 98.1 F (36.7 C) (Oral)    Resp 16    Ht 5\' 5"  (1.651 m)    Wt 59.9 kg    LMP 09/02/2019    SpO2 99%    BMI 21.97 kg/m   Physical Exam Constitutional:      General: She is not in acute distress.    Appearance: Normal appearance. She is well-developed. She is not toxic-appearing or diaphoretic.     Comments: Uncomfortable appearing  HENT:     Head: Normocephalic and atraumatic.      Right Ear: External ear normal.     Left Ear: External ear normal.     Nose: Nose normal.  Eyes:     General: Vision grossly intact. Gaze aligned appropriately.     Pupils: Pupils are equal, round, and reactive to light.  Neck:     Trachea: Trachea and phonation normal. No tracheal deviation.  Pulmonary:     Effort: Pulmonary effort is normal. No respiratory distress.  Abdominal:     General: There is no distension.     Palpations: Abdomen is soft.     Tenderness: There is abdominal tenderness in the left lower quadrant. There is no guarding or rebound.  Genitourinary:    Comments: Exam chaperoned by Marcie Bal.  Pelvic exam: normal external genitalia without evidence of trauma. VULVA: normal appearing vulva with no masses, tenderness or lesion. VAGINA: normal appearing vagina with normal color and discharge, no lesions. CERVIX: normal appearing cervix without lesions, cervical motion tenderness absent, scant bleeding present through cervical os Wet prep and DNA probe for chlamydia and GC obtained.   ADNEXA: Appear normal size, mild left sided tenderness UTERUS: Appears normal size and shape, nontender. Musculoskeletal:        General: Normal range of motion.     Cervical back: Normal range of motion.  Skin:    General: Skin is warm and dry.  Neurological:     Mental Status: She is alert.     GCS: GCS eye subscore is 4. GCS verbal subscore is 5. GCS motor subscore is 6.     Comments: Speech is clear and goal oriented, follows commands Major Cranial nerves without deficit, no facial droop Moves extremities without ataxia, coordination intact  Psychiatric:        Behavior: Behavior normal.    ED Results / Procedures / Treatments   Labs (all labs ordered are listed, but only abnormal results are displayed) Labs Reviewed  WET PREP, GENITAL - Abnormal; Notable for the following components:      Result Value   WBC, Wet Prep HPF POC FEW (*)    All other components within normal  limits  CBC WITH DIFFERENTIAL/PLATELET - Abnormal; Notable for the following components:   WBC 11.0 (*)    Hemoglobin 15.4 (*)    Neutro Abs 8.9 (*)    All other components within normal limits  COMPREHENSIVE METABOLIC PANEL - Abnormal; Notable for the following components:   Potassium 3.4 (*)  CO2 20 (*)    Glucose, Bld 131 (*)    All other components within normal limits  URINALYSIS, ROUTINE W REFLEX MICROSCOPIC - Abnormal; Notable for the following components:   APPearance HAZY (*)    Hgb urine dipstick LARGE (*)    Ketones, ur 15 (*)    Leukocytes,Ua TRACE (*)    All other components within normal limits  URINALYSIS, MICROSCOPIC (REFLEX) - Abnormal; Notable for the following components:   Bacteria, UA FEW (*)    All other components within normal limits  URINE CULTURE  LIPASE, BLOOD  PREGNANCY, URINE  HIV ANTIBODY (ROUTINE TESTING W REFLEX)  HCG, SERUM, QUALITATIVE  RPR  GC/CHLAMYDIA PROBE AMP ([place]) NOT AT [facility]    EKG None  Radiology CT ABDOMEN PELVIS W CONTRAST  Result Date: 09/02/2019 CLINICAL DATA:  Severe left lower quadrant abdominal pain EXAM: CT ABDOMEN AND PELVIS WITH CONTRAST TECHNIQUE: Multidetector CT imaging of the abdomen and pelvis was performed using the standard protocol following bolus administration of intravenous contrast. CONTRAST:  100mL OMNIPAQUE IOHEXOL 300 MG/ML  SOLN COMPARISON:  Pelvic ultrasound 03/11/2016, 09/02/2019 FINDINGS: Lower chest: No acute abnormality. Hepatobiliary: Gallbladder surgically absent. There is mild intrahepatic and extrahepatic biliary dilatation, likely reflecting post cholecystectomy changes. Liver is otherwise unremarkable without focal lesion. Pancreas: Unremarkable. No pancreatic ductal dilatation or surrounding inflammatory changes. Spleen: Normal in size without focal abnormality. Adrenals/Urinary Tract: Unremarkable adrenal glands. Small amount of hyperdense material within the right renal calices, likely  early contrast excretion. Kidneys appear otherwise unremarkable. No hydronephrosis. Urothelial enhancement of a nondilated left ureter (for example: Series 2, image 47). Urinary bladder appears within normal limits for the degree of distension. Stomach/Bowel: Stomach is within normal limits. Appendix is not definitively seen although no pericecal inflammatory changes are identified to suggest appendicitis. No evidence of bowel wall thickening, distention, or inflammatory changes. Vascular/Lymphatic: No significant vascular findings are present. No enlarged abdominal or pelvic lymph nodes. Reproductive: Retroverted uterus. Multilobulated fluid-filled structure in the left adnexa with simple fluid internal density compatible with known chronic left hydrosalpinx. No inflammatory changes or free fluid to suggest infected or inflamed adnexal process. Other: No abdominal wall hernia or abnormality. No abdominopelvic ascites. Musculoskeletal: No acute or significant osseous findings. IMPRESSION: 1. Left-sided urothelial enhancement without hydronephrosis. Correlate with urinalysis for possible urinary tract infection. 2. Multilobulated fluid-filled structure in the left adnexa with simple fluid internal density compatible with known chronic left hydrosalpinx. No inflammatory changes or free fluid to suggest infected or inflamed adnexal process. 3. Mild intrahepatic and extrahepatic biliary dilatation, likely reflecting post cholecystectomy changes. Electronically Signed   By: Duanne GuessNicholas  Plundo D.O.   On: 09/02/2019 16:04   US PELVIC COMPLETE W TRANSVAGINAL AND TORSION R/O  Result Date: 09/02/2019 CLINICAL DATA:  Left-sided pelvic pain, history of left hydrosalpinx EXAM: TRANSABDOMINAL AND TRANSVAGINAL ULTRASOUND OF PELVIS DOPPLER ULTRASOUND OF OVARIES TECHNIQUE: Both transabdominal and transvaginal ultrasound examinations of the pelvis were performed. Transabdominal technique was performed for global imaging of the  pelvis including uterus, ovaries, adnexal regions, and pelvic cul-de-sac. It was necessary to proceed with endovaginal exam following the transabdominal exam to visualize the ovaries. Color and duplex Doppler ultrasound was utilized to evaluate blood flow to the ovaries. COMPARISON:  03/11/2016 FINDINGS: Uterus Measurements: 8.2 x 4.5 x 4.9 cm. = volume: 95 mL. No fibroids or other mass visualized. Endometrium Thickness: 12 mm.  No focal abnormality visualized. Right ovary Not well visualized Left ovary Measurements: 3.5 x 3.0 x 2.0 cm. =  volume: 12 mL. There is a cyst within the left ovary measuring 1.8 cm in greatest dimension. Adjacent to the left ovary there is again noted a dilated tubular structure similar to that noted on the prior exam consistent with hydrosalpinx. This is stable in appearance. Pulsed Doppler evaluation of both ovaries demonstrates normal low-resistance arterial and venous waveforms. Other findings No abnormal free fluid. IMPRESSION: Stable left hydrosalpinx. Small left ovarian cyst. Nonvisualization of the right ovary. No other focal abnormality is noted. Electronically Signed   By: Alcide Clever M.D.   On: 09/02/2019 14:07    Procedures Procedures (including critical care time)  Medications Ordered in ED Medications  sodium chloride 0.9 % bolus 500 mL ( Intravenous Stopped 09/02/19 1254)  HYDROmorphone (DILAUDID) injection 1 mg (1 mg Intravenous Given 09/02/19 1214)  ondansetron (ZOFRAN) injection 4 mg (4 mg Intravenous Given 09/02/19 1211)  HYDROmorphone (DILAUDID) injection 1 mg (1 mg Intravenous Given 09/02/19 1323)  iohexol (OMNIPAQUE) 300 MG/ML solution 100 mL (100 mLs Intravenous Contrast Given 09/02/19 1532)    ED Course  I have reviewed the triage vital signs and the nursing notes.  Pertinent labs & imaging results that were available during my care of the patient were reviewed by me and considered in my medical decision making (see chart for details).    MDM  Rules/Calculators/A&P                     48 year old female arrives with sudden onset left lower quadrant pain with associated nausea and vomiting, began after menstrual cycle today.  She reports history of similar in the past with hydrosalpinx.  There is concern for ovarian etiology of her pain today.  CBC, CMP, lipase, urinalysis, pregnancy test, pelvic ultrasound with Doppler, pain/nausea medication, wet prep, GC chlamydia, HIV RPR ordered. - Pregnancy test negative CBC with extensiveness of 1.0 with left shift CMP glucose 131, CO2 20, potassium 3.4, nonacute Lipase within normal limits Urinalysis with hemoglobin, ketones and trace leukocytes, few bacteria Pelvic Ultrasound with Doppler: IMPRESSION:  Stable left hydrosalpinx.    Small left ovarian cyst.    Nonvisualization of the right ovary.    No other focal abnormality is noted.   Wet prep with few WBCs, otherwise negative HIV negative GC chlamydia pending RPR pending  Urinalysis consistent with patient's menstrual cycle, does not appear infectious in addition patient without urinary symptoms low suspicion for UTI at this time.  Urine culture sent.  Wet prep shows WBCs, pelvic examination shows menstrual bleeding, no cervical motion tenderness or abnormal discharge concerning for STI additionally patient denies concern for STI today.  No evidence of PID.  Patient requiring multiple doses of pain medication today, possibility patient's symptoms secondary to menstrual cycle, ovarian cyst and hydrosalpinx however concern for possible intra-abdominal etiology of her symptoms obtain CT abdomen pelvis.  Discussed with Dr. Criss Alvine who agrees.  On reassessment patient resting comfortably no acute distress. - CT AP: IMPRESSION:  1. Left-sided urothelial enhancement without hydronephrosis.  Correlate with urinalysis for possible urinary tract infection.  2. Multilobulated fluid-filled structure in the left adnexa with  simple fluid  internal density compatible with known chronic left  hydrosalpinx. No inflammatory changes or free fluid to suggest  infected or inflamed adnexal process.  3. Mild intrahepatic and extrahepatic biliary dilatation, likely  reflecting post cholecystectomy changes.   - Discussed case with Dr. Dalene Seltzer, urothelial enhancement does not correlate to UTI today, patient will follow up with her primary care provider for  further evaluation regarding this.  As to patient's other findings and complaints today we will consult OB/GYN. - 5:10 PM: Consult called to OB/GYN Dr. Debroah Loop, case discussed and imaging in lab work reviewed.  He advises that no further work-up indicated at this time, treat patient with NSAIDs and have her follow-up outpatient with their office. - 6 PM: Patient reassessed resting comfortably no acute distress, advised on work-up above and states understanding, she reports improvement of symptoms since arrival.  She is agreeable for discharge and outpatient OB/GYN follow-up.  I also advised patient of left sided urothelial enhancement without hydronephrosis and that she should discuss this with her primary care provider and her OB/GYN at her next visit.  At this time there does not appear to be any evidence of an acute emergency medical condition and the patient appears stable for discharge with appropriate outpatient follow up. Diagnosis was discussed with patient who verbalizes understanding of care plan and is agreeable to discharge. I have discussed return precautions with patient who verbalizes understanding of return precautions. Patient encouraged to follow-up with their PCP and OBGYN. All questions answered.  Patient's case discussed with Dr. Dalene Seltzer who agrees with plan to discharge with follow-up.   Note: Portions of this report may have been transcribed using voice recognition software. Every effort was made to ensure accuracy; however, inadvertent computerized transcription  errors may still be present. Final Clinical Impression(s) / ED Diagnoses Final diagnoses:  LLQ abdominal pain  Cyst of left ovary  Hydrosalpinx    Rx / DC Orders ED Discharge Orders         Ordered    naproxen (NAPROSYN) 500 MG tablet  2 times daily     09/02/19 1818           Elizabeth Palau 09/02/19 1819    Alvira Monday, MD 09/05/19 2152

## 2019-09-02 NOTE — Telephone Encounter (Signed)
Pt. Complaining of severe left lower quadrant pain starting today. Had the "same thing three years ago and it was my fallopian tube." Instructed to go to ED. Does not have a PCP or OB/GYN presently.  Reason for Disposition . [1] SEVERE pain (e.g., excruciating) AND [2] present > 1 hour  Answer Assessment - Initial Assessment Questions 1. LOCATION: "Where does it hurt?"      Left lower quadrant 2. RADIATION: "Does the pain shoot anywhere else?" (e.g., chest, back)     No 3. ONSET: "When did the pain begin?" (e.g., minutes, hours or days ago)      Today 4. SUDDEN: "Gradual or sudden onset?"     Sudden 5. PATTERN "Does the pain come and go, or is it constant?"    - If constant: "Is it getting better, staying the same, or worsening?"      (Note: Constant means the pain never goes away completely; most serious pain is constant and it progresses)     - If intermittent: "How long does it last?" "Do you have pain now?"     (Note: Intermittent means the pain goes away completely between bouts)     Constant 6. SEVERITY: "How bad is the pain?"  (e.g., Scale 1-10; mild, moderate, or severe)   - MILD (1-3): doesn't interfere with normal activities, abdomen soft and not tender to touch    - MODERATE (4-7): interferes with normal activities or awakens from sleep, tender to touch    - SEVERE (8-10): excruciating pain, doubled over, unable to do any normal activities      10 7. RECURRENT SYMPTOM: "Have you ever had this type of abdominal pain before?" If so, ask: "When was the last time?" and "What happened that time?"      Yes - 3 years ago 8. CAUSE: "What do you think is causing the abdominal pain?"     Fallopian tube 9. RELIEVING/AGGRAVATING FACTORS: "What makes it better or worse?" (e.g., movement, antacids, bowel movement)     No 10. OTHER SYMPTOMS: "Has there been any vomiting, diarrhea, constipation, or urine problems?"       No 11. PREGNANCY: "Is there any chance you are pregnant?" "When was  your last menstrual period?"       No  Protocols used: ABDOMINAL PAIN - [facility]

## 2019-09-02 NOTE — ED Notes (Signed)
Po fluids given per pa

## 2019-09-02 NOTE — ED Notes (Signed)
US at bedside

## 2019-09-02 NOTE — ED Notes (Signed)
ED Provider at bedside. 

## 2019-09-02 NOTE — Discharge Instructions (Addendum)
You have been diagnosed today with Left Lower Quadrant Pain, Ovarian Cyst, Hydrosalpinx.  At this time there does not appear to be the presence of an emergent medical condition, however there is always the potential for conditions to change. Please read and follow the below instructions.  Please return to the Emergency Department immediately for any new or worsening symptoms. Please be sure to follow up with your Primary Care Provider within one week regarding your visit today; please call their office to schedule an appointment even if you are feeling better for a follow-up visit. Your CT today showed left sided urothelial enhancement without hydronephrosis.  Additionally it showed mild intrahepatic and extrahepatic biliary dilation.  Please review your CT results with your primary care provider in their entirety at your follow-up visit next week.  You may view your CT scan results on your MyChart account. Your ultrasound today showed your left hydrosalpinx, a small left ovarian cyst as well.  Please discuss these with your primary care provider and your OB/GYN at your follow-up visit. Please call the OB/GYN's office on your discharge paperwork with Dr. Roselie Awkward to schedule a follow-up visit.  Call their office tomorrow morning to schedule a follow-up visit. Please drink plenty water and get plenty of rest, you may take the pain medication naproxen as prescribed.  Takes medication with food and drink plenty of water.  Do not take any other NSAID medications such as ibuprofen or Aleve with naproxen.  Get help right away if: Your pain does not go away as soon as your doctor says it should. You cannot stop vomiting. Your pain is only in areas of your belly, such as the right side or the left lower part of the belly. You have bloody or black poop, or poop that looks like tar. You have very bad pain, cramping, or bloating in your belly. You have signs of not having enough fluid or water in your body  (dehydration), such as: Dark pee, very little pee, or no pee. Cracked lips. Dry mouth. Sunken eyes. Sleepiness. Weakness. You have trouble breathing or chest pain. You have belly pain that is very bad or gets worse. You cannot eat or drink without throwing up (vomiting). You suddenly get a fever. Your period is a lot heavier than usual. You have any new/concerning or worsening of symptoms  Please read the additional information packets attached to your discharge summary.  Do not take your medicine if  develop an itchy rash, swelling in your mouth or lips, or difficulty breathing; call 911 and seek immediate emergency medical attention if this occurs.  Note: Portions of this text may have been transcribed using voice recognition software. Every effort was made to ensure accuracy; however, inadvertent computerized transcription errors may still be present.

## 2019-09-02 NOTE — ED Triage Notes (Signed)
Pt arrived from home with c/o sudden abdominal pain starting this morning. Pain is LLQ w/o radiation. Pt has NVD.

## 2019-09-02 NOTE — ED Notes (Signed)
Lab informed of Urine culture order.

## 2019-09-02 NOTE — ED Notes (Signed)
Patient transported to CT 

## 2019-09-03 LAB — URINE CULTURE

## 2019-09-03 LAB — RPR: RPR Ser Ql: NONREACTIVE

## 2019-09-06 LAB — GC/CHLAMYDIA PROBE AMP (~~LOC~~) NOT AT ARMC
Chlamydia: NEGATIVE
Neisseria Gonorrhea: NEGATIVE

## 2021-06-03 IMAGING — CT CT ABD-PELV W/ CM
2 of 5 series · 15 of 46 positions shown, 17 images · IV contrast (omnipaque)
Comparison: Pelvic ultrasound 03/11/2016, 09/02/2019

CLINICAL DATA: Severe left lower quadrant abdominal pain

EXAM:
CT ABDOMEN AND PELVIS WITH CONTRAST
TECHNIQUE: Multidetector CT imaging of the abdomen and pelvis was performed
using the standard protocol following bolus administration of
intravenous contrast.
CONTRAST:  100mL OMNIPAQUE IOHEXOL 300 MG/ML  SOLN

[Series 2: axial st · axial · 0.61mm/px · z∈[-633,-213]mm · 12 of 94 slices shown, 14 images]
[im 5/94  soft-tissue]
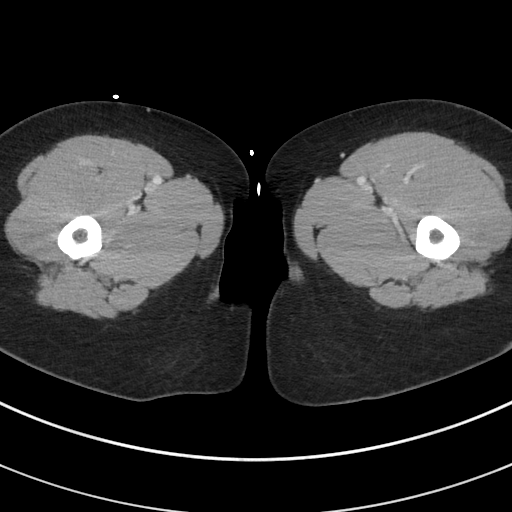
[im 5/94  bone]
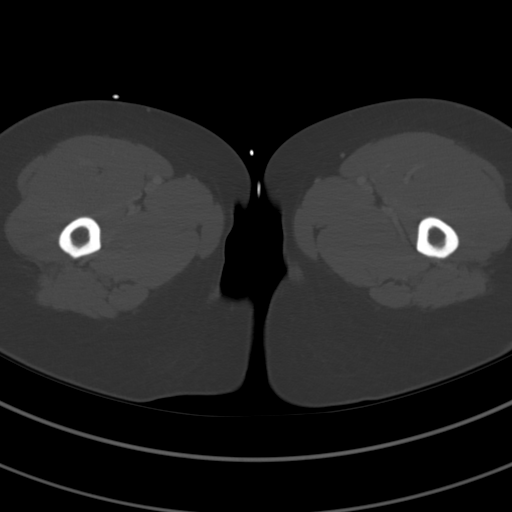
[im 15/94  soft-tissue]
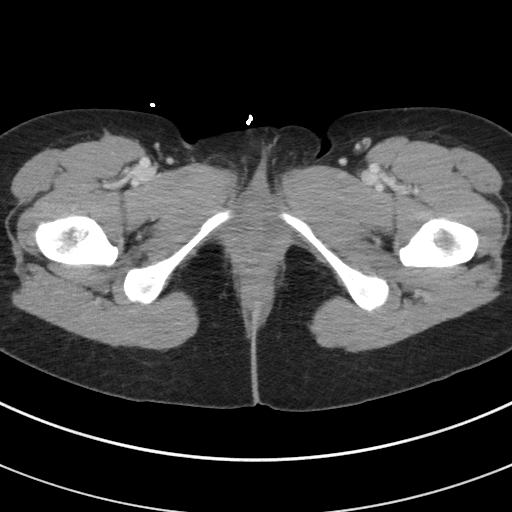
[im 20/94  soft-tissue]
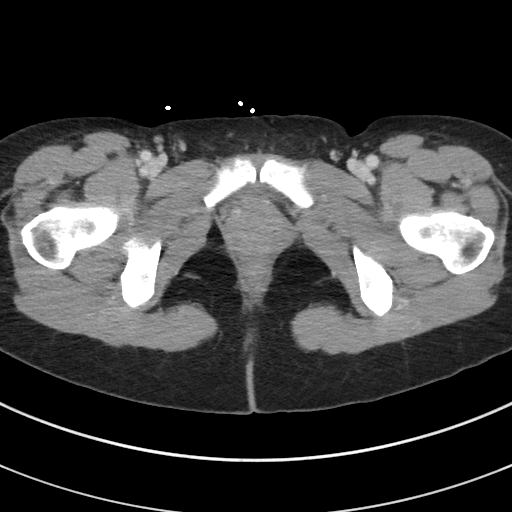
[im 30/94  soft-tissue]
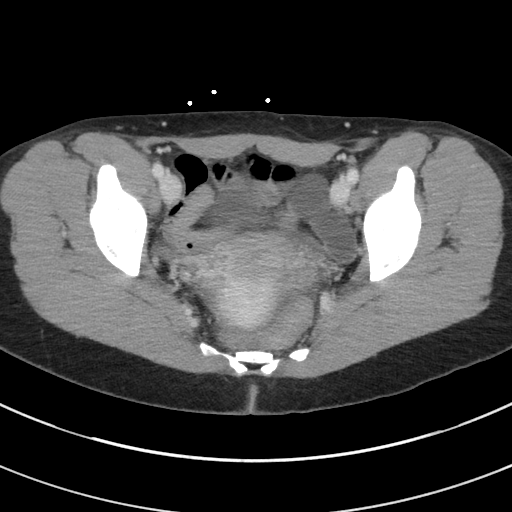
[im 35/94  soft-tissue]
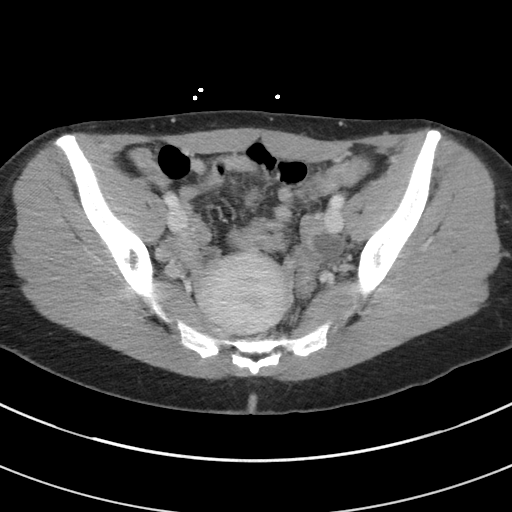
[im 45/94  soft-tissue]
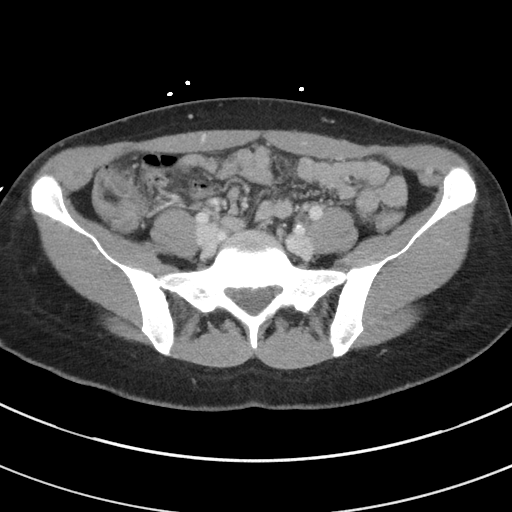
[im 49/94  soft-tissue]
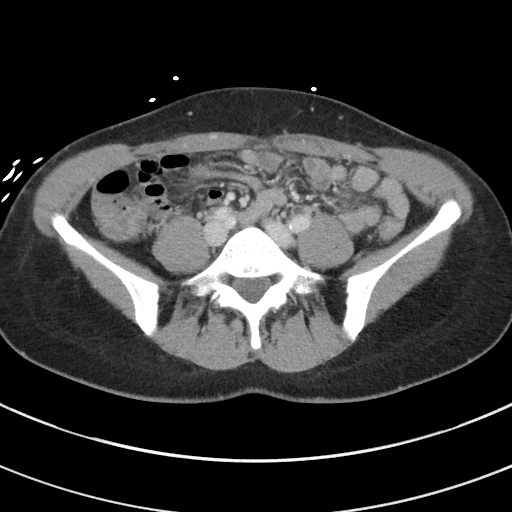
[im 59/94  soft-tissue]
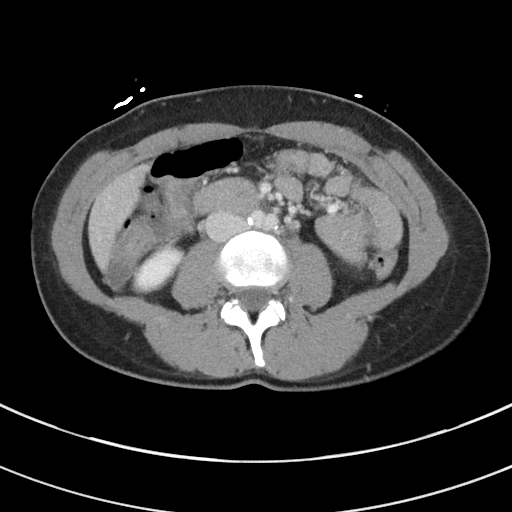
[im 64/94  soft-tissue]
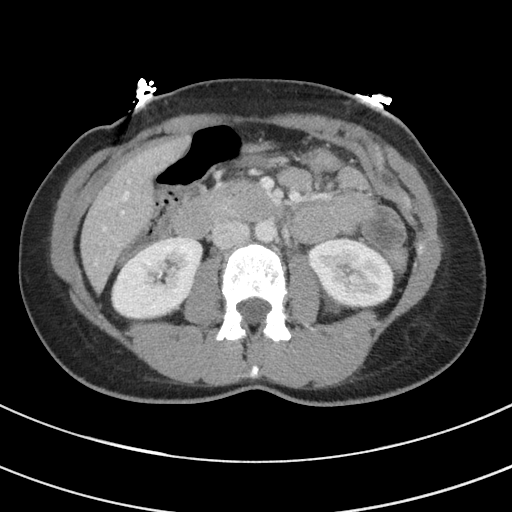
[im 64/94  bone]
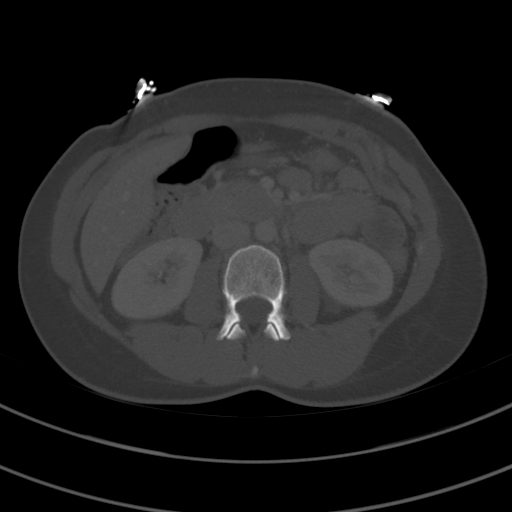
[im 74/94  soft-tissue]
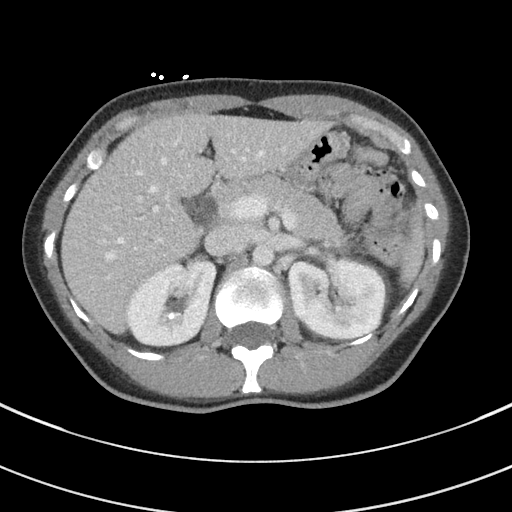
[im 79/94  soft-tissue]
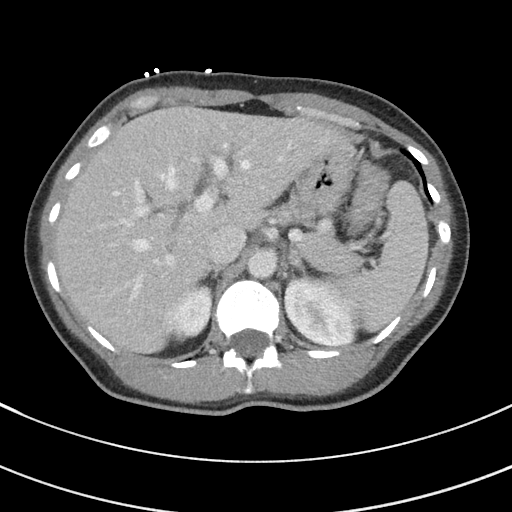
[im 89/94  soft-tissue]
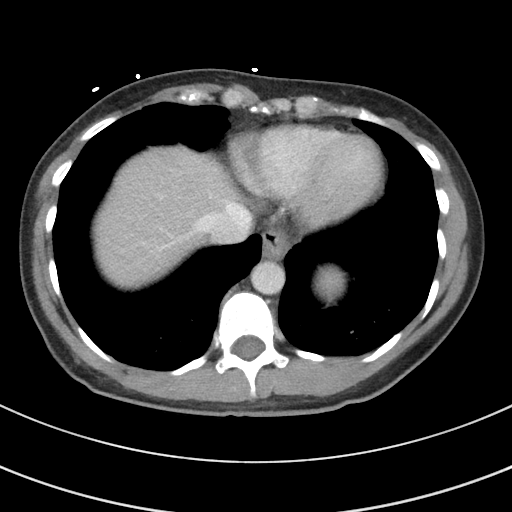

[Series 5: coronal st · coronal · 0.68mm/px · 3 of 81 slices shown]
[im 27/81  soft-tissue]
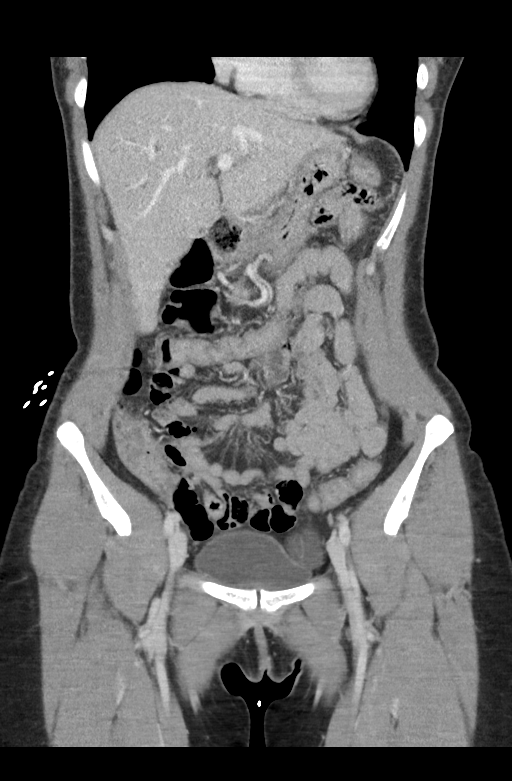
[im 36/81  soft-tissue]
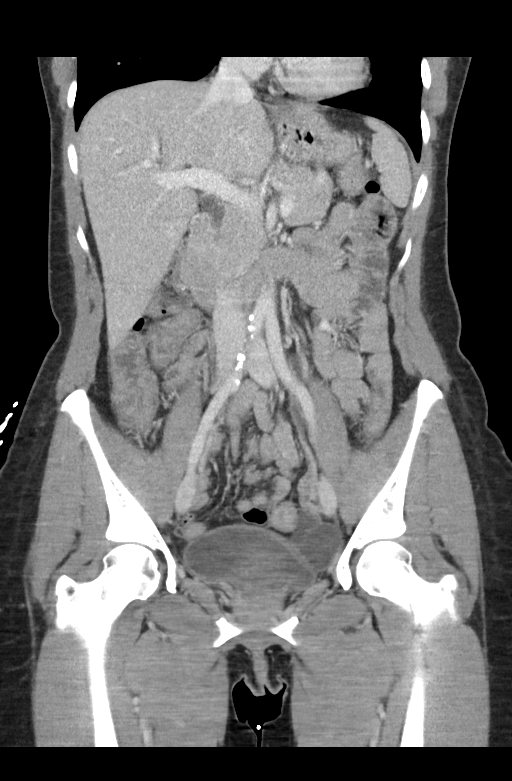
[im 45/81  soft-tissue]
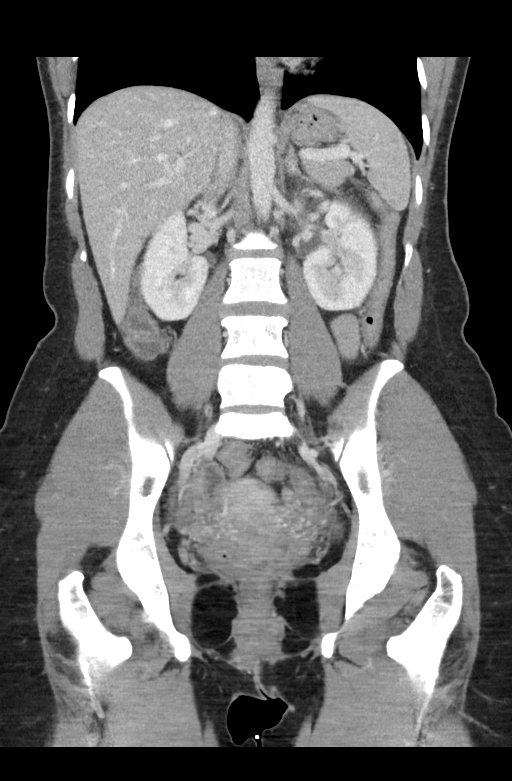

[15 of 46 positions shown; findings below may reference images not displayed]

FINDINGS: Lower chest: No acute abnormality.

Hepatobiliary: Gallbladder surgically absent. There is mild
intrahepatic and extrahepatic biliary dilatation, likely reflecting
post cholecystectomy changes. Liver is otherwise unremarkable
without focal lesion.

Pancreas: Unremarkable. No pancreatic ductal dilatation or
surrounding inflammatory changes.

Spleen: Normal in size without focal abnormality.

Adrenals/Urinary Tract: Unremarkable adrenal glands. Small amount of
hyperdense material within the right renal calices, likely early
contrast excretion. Kidneys appear otherwise unremarkable. No
hydronephrosis. Urothelial enhancement of a nondilated left ureter
(for example: Series 2, image 47). Urinary bladder appears within
normal limits for the degree of distension.

Stomach/Bowel: Stomach is within normal limits. Appendix is not
definitively seen although no pericecal inflammatory changes are
identified to suggest appendicitis. No evidence of bowel wall
thickening, distention, or inflammatory changes.

Vascular/Lymphatic: No significant vascular findings are present. No
enlarged abdominal or pelvic lymph nodes.

Reproductive: Retroverted uterus. Multilobulated fluid-filled
structure in the left adnexa with simple fluid internal density
compatible with known chronic left hydrosalpinx. No inflammatory
changes or free fluid to suggest infected or inflamed adnexal
process.

Other: No abdominal wall hernia or abnormality. No abdominopelvic
ascites.

Musculoskeletal: No acute or significant osseous findings.
IMPRESSION: 1. Left-sided urothelial enhancement without hydronephrosis.
Correlate with urinalysis for possible urinary tract infection.
2. Multilobulated fluid-filled structure in the left adnexa with
simple fluid internal density compatible with known chronic left
hydrosalpinx. No inflammatory changes or free fluid to suggest
infected or inflamed adnexal process.
3. Mild intrahepatic and extrahepatic biliary dilatation, likely
reflecting post cholecystectomy changes.

## 2021-06-20 ENCOUNTER — Other Ambulatory Visit (HOSPITAL_BASED_OUTPATIENT_CLINIC_OR_DEPARTMENT_OTHER): Payer: Self-pay

## 2021-06-20 ENCOUNTER — Encounter (HOSPITAL_BASED_OUTPATIENT_CLINIC_OR_DEPARTMENT_OTHER): Payer: Self-pay

## 2021-06-20 ENCOUNTER — Other Ambulatory Visit: Payer: Self-pay

## 2021-06-20 ENCOUNTER — Emergency Department (HOSPITAL_BASED_OUTPATIENT_CLINIC_OR_DEPARTMENT_OTHER)
Admission: EM | Admit: 2021-06-20 | Discharge: 2021-06-20 | Disposition: A | Discharge status: Home / Self Care | Payer: Self-pay | Attending: Emergency Medicine | Admitting: Emergency Medicine

## 2021-06-20 DIAGNOSIS — Z87891 Personal history of nicotine dependence: Secondary | ICD-10-CM | POA: Insufficient documentation

## 2021-06-20 DIAGNOSIS — Z7951 Long term (current) use of inhaled steroids: Secondary | ICD-10-CM | POA: Insufficient documentation

## 2021-06-20 DIAGNOSIS — Y848 Other medical procedures as the cause of abnormal reaction of the patient, or of later complication, without mention of misadventure at the time of the procedure: Secondary | ICD-10-CM | POA: Insufficient documentation

## 2021-06-20 DIAGNOSIS — J4520 Mild intermittent asthma, uncomplicated: Secondary | ICD-10-CM | POA: Insufficient documentation

## 2021-06-20 DIAGNOSIS — T8182XA Emphysema (subcutaneous) resulting from a procedure, initial encounter: Secondary | ICD-10-CM | POA: Insufficient documentation

## 2021-06-20 MED ORDER — METHYLPREDNISOLONE 4 MG PO TBPK
ORAL_TABLET | ORAL | 0 refills | Status: AC
  Filled 2021-06-20: qty 21, 6d supply, fill #0

## 2021-06-20 NOTE — Discharge Instructions (Signed)
You were seen today for evaluation of facial swelling following your dental procedure.  I spoke with our on-call dentist Dr. Mia Creek who feels that imaging or lab work not warranted at this time.  He recommends Medrol Dosepak and continuing of Augmentin that you were prescribed by your dentist along with over-the-counter Tylenol and ibuprofen for pain management.  He also states that resolution of symptoms can take anywhere between 1 and 5 days.   Follow-up with your dentist for continued evaluation and further management.  Return if symptoms worsen or you develop shortness of breath or airway compromise.

## 2021-06-20 NOTE — Progress Notes (Signed)
RT in to see pt in triage for swelling to face. Bilateral breath sounds clear, no stridor noted at this time. Pt able to speak in full sentences, no distress noted. Swelling/subcutaneous air to the pts left cheek and neck noted.  RT will continue to monitor and be available as needed.

## 2021-06-20 NOTE — ED Provider Notes (Signed)
[facility] EMERGENCY DEPARTMENT Provider Note   CSN: [id number] Arrival date & time: 06/20/21  1215     History Chief Complaint  Patient presents with   Facial Swelling    Brandi Davidson is a 50 y.o. female.  Patient presents today with facial swelling following dental procedure.  Patient states that earlier today she went to the dentist to receive a filling and during the procedure the left side of her face began to swell.  This originally was concern for allergic reaction, however determined that patient had instead experienced subcutaneous emphysema.  Prescribed Augmentin for same, and sent patient to emergency department for evaluation and concern for airway compromise.  Endorses pain to the left side of her face, however denies shortness of breath, angioedema, throat pain, or any feeling as though her airway is closing.  She is alert and awake, and speaking in complete sentences.  The history is provided by the patient. No language interpreter was used.      Past Medical History:  Diagnosis Date   Pancreatitis due to biliary obstruction    PCOD (polycystic ovarian disease)    Ruptured ovarian cyst     Patient Active Problem List   Diagnosis Date Noted   Allergic urticaria 08/03/2018   Other allergic rhinitis 08/03/2018   Mild intermittent asthma without complication 08/03/2018   Lactose intolerance 08/03/2018   Irritable bowel syndrome with constipation 08/03/2018    Past Surgical History:  Procedure Laterality Date   CHOLECYSTECTOMY     WISDOM TOOTH EXTRACTION       OB History     Gravida  4   Para  1   Term  1   Preterm      AB      Living  1      SAB      IAB      Ectopic      Multiple      Live Births              Family History  Problem Relation Age of Onset   COPD Mother    Asthma Mother    Bronchitis Mother    Emphysema Mother    Hypertension Sister    Asthma Maternal Grandmother    Bronchitis Maternal  Grandmother    Emphysema Maternal Grandmother    Allergic rhinitis Son    Eczema Son    Cancer Neg Hx    Urticaria Neg Hx    Immunodeficiency Neg Hx    Angioedema Neg Hx     Social History   Tobacco Use   Smoking status: Former    Types: Cigarettes    Quit date: 08/03/1998    Years since quitting: 22.8   Smokeless tobacco: Never  Vaping Use   Vaping Use: Never used  Substance Use Topics   Alcohol use: Yes    Comment: occasional/social   Drug use: Never    Home Medications Prior to Admission medications   Medication Sig Start Date End Date Taking? Authorizing Provider  albuterol (PROAIR HFA) 108 (90 Base) MCG/ACT inhaler Two puffs every 4 hours if needed for wheezing or coughing spells.  May use 2 puffs 5-15 minutes before exercise. 08/03/18   Fletcher Anon, MD  cetirizine (ZYRTEC) 10 MG tablet Take 10 mg by mouth daily.    [provider]  fexofenadine (ALLEGRA) 180 MG tablet Take by mouth.    [provider]  LINZESS 290 MCG CAPS capsule  07/06/18  [provider]  triamcinolone (NASACORT) 55 MCG/ACT AERO nasal inhaler Place 2 sprays into the nose daily.    [provider]    Allergies    Adhesive [tape], Erythromycin, Latex, Lidocaine, Sulfa antibiotics, and Tetracyclines & related  Review of Systems   Review of Systems  Constitutional:  Negative for chills and fever.  HENT:  Positive for dental problem and facial swelling. Negative for drooling, mouth sores, sore throat, trouble swallowing and voice change.   Respiratory:  Negative for apnea, choking and shortness of breath.   Gastrointestinal:  Negative for diarrhea, nausea and vomiting.  Musculoskeletal:  Negative for neck pain and neck stiffness.  Skin:  Negative for color change, rash and wound.  Neurological:  Negative for dizziness, tremors, seizures, syncope, speech difficulty, weakness, light-headedness, numbness and headaches.  Psychiatric/Behavioral:  Negative for  confusion and decreased concentration.   All other systems reviewed and are negative.  Physical Exam Updated Vital Signs BP 122/75   Pulse 63   Temp 98.4 F (36.9 C) (Oral)   Resp 18   Ht 5\' 5"  (1.651 m)   Wt 56.7 kg   LMP 06/11/2021 (Exact Date)   SpO2 100%   BMI 20.80 kg/m   Physical Exam Vitals and nursing note reviewed.  Constitutional:      General: She is not in acute distress.    Appearance: Normal appearance. She is normal weight. She is not ill-appearing, toxic-appearing or diaphoretic.  HENT:     Head: Normocephalic and atraumatic.     Comments: Swelling with crepitus noted to left lateral shoulder area.  No fluctuance or induration.  Mouth normal appearing, no swelling, lesions, or other abnormalities noted. No airway compromise    Right Ear: External ear normal.     Left Ear: External ear normal.     Nose: Nose normal.     Mouth/Throat:     Lips: Pink.     Mouth: Mucous membranes are moist. No angioedema.     Dentition: Normal dentition. No gingival swelling or dental abscesses.     Tongue: No lesions. Tongue does not deviate from midline.     Palate: No mass and lesions.     Pharynx: Oropharynx is clear. Uvula midline. No pharyngeal swelling, oropharyngeal exudate, posterior oropharyngeal erythema or uvula swelling.     Tonsils: No tonsillar exudate or tonsillar abscesses.  Eyes:     Extraocular Movements: Extraocular movements intact.     Pupils: Pupils are equal, round, and reactive to light.  Neck:     Trachea: Trachea and phonation normal. No tracheal tenderness, abnormal tracheal secretions or tracheal deviation.     Comments: Area of swelling and crepitus isolated to jaw area, does not extend to the neck Cardiovascular:     Rate and Rhythm: Normal rate.  Pulmonary:     Effort: Pulmonary effort is normal. No respiratory distress.  Musculoskeletal:        General: Normal range of motion.     Cervical back: Full passive range of motion without pain  and normal range of motion. No edema, erythema, rigidity or crepitus. No pain with movement. Normal range of motion.  Lymphadenopathy:     Cervical: No cervical adenopathy.  Skin:    General: Skin is warm and dry.  Neurological:     General: No focal deficit present.     Mental Status: She is alert.  Psychiatric:        Mood and Affect: Mood normal.  Behavior: Behavior normal.    ED Results / Procedures / Treatments   Labs (all labs ordered are listed, but only abnormal results are displayed) Labs Reviewed - No data to display  EKG None  Radiology No results found.  Procedures Procedures   Medications Ordered in ED Medications - No data to display  ED Course  I have reviewed the triage vital signs and the nursing notes.  Pertinent labs & imaging results that were available during my care of the patient were reviewed by me and considered in my medical decision making (see chart for details).    MDM Rules/Calculators/A&P                         Patient presents today with left-sided facial swelling following dental procedure.  Patient's dentist Dr. Tomasa Blase in [facility] contacted oral surgeon following her procedure who advised patient be placed on Augmentin and be sent to the emergency department for evaluation of potential airway compromise. Patient with swelling and crepitus noted to jaw area without fluctuance, induration or drainage.  No intraoral abnormalities, swelling does not extend into the neck.  Patient awake, alert, oriented, speaking in complete sentences and in no acute distress throughout visit.  Spoke with on-call dentist Dr. Mia Creek who stated that imaging was not indicated, and recommended discharge with Augmentin and Medrol Dosepak with close dental follow-up. Will follow recommendations.  Plan discussed with patient who is amenable with plan, educated on red flag symptoms that would prompt immediate return.  Discharged in stable condition.  Findings  and plan of care discussed with supervising physician Dr. Adela Lank who is in agreement.    Final Clinical Impression(s) / ED Diagnoses Final diagnoses:  Subcutaneous emphysema after procedure    Rx / DC Orders ED Discharge Orders          Ordered    methylPREDNISolone (MEDROL DOSEPAK) 4 MG TBPK tablet        06/20/21 1443          An After Visit Summary was printed and given to the patient.    Vear Clock 06/20/21 2120    Melene Plan, DO 06/23/21 2132

## 2021-06-20 NOTE — ED Triage Notes (Signed)
Pt arrives from dentist and was getting a filling. Was sent here because air had gotten under gum and had "subcutaneous emphysema" under gum & face swelled. Denies shortness of breath.   RT evaluating in triage
